# Patient Record
Sex: Male | Born: 1965
Health system: Southern US, Community
[De-identification: ages and names within clinical notes are randomized; demographics above are authoritative.]

## PROBLEM LIST (undated history)

## (undated) DIAGNOSIS — F329 Major depressive disorder, single episode, unspecified: Secondary | ICD-10-CM

## (undated) DIAGNOSIS — I10 Essential (primary) hypertension: Secondary | ICD-10-CM

## (undated) DIAGNOSIS — F909 Attention-deficit hyperactivity disorder, unspecified type: Secondary | ICD-10-CM

## (undated) DIAGNOSIS — N529 Male erectile dysfunction, unspecified: Secondary | ICD-10-CM

## (undated) DIAGNOSIS — F1020 Alcohol dependence, uncomplicated: Secondary | ICD-10-CM

## (undated) DIAGNOSIS — F32A Depression, unspecified: Secondary | ICD-10-CM

## (undated) HISTORY — DX: Depression, unspecified: F32.A

## (undated) HISTORY — DX: Male erectile dysfunction, unspecified: N52.9

## (undated) HISTORY — DX: Alcohol dependence, uncomplicated: F10.20

## (undated) HISTORY — PX: OTHER SURGICAL HISTORY: SHX169

## (undated) HISTORY — DX: Attention-deficit hyperactivity disorder, unspecified type: F90.9

## (undated) HISTORY — DX: Major depressive disorder, single episode, unspecified: F32.9

---

## 2006-06-27 ENCOUNTER — Ambulatory Visit: Payer: Self-pay | Admitting: Family Medicine

## 2006-08-07 ENCOUNTER — Ambulatory Visit: Payer: Self-pay | Admitting: Family Medicine

## 2006-12-05 ENCOUNTER — Ambulatory Visit: Payer: Self-pay | Admitting: Family Medicine

## 2006-12-11 ENCOUNTER — Encounter: Admission: RE | Admit: 2006-12-11 | Discharge: 2006-12-11 | Payer: Self-pay | Admitting: Family Medicine

## 2007-05-07 ENCOUNTER — Telehealth: Payer: Self-pay | Admitting: Family Medicine

## 2007-06-10 DIAGNOSIS — F418 Other specified anxiety disorders: Secondary | ICD-10-CM

## 2007-06-10 HISTORY — DX: Other specified anxiety disorders: F41.8

## 2007-07-15 ENCOUNTER — Ambulatory Visit: Payer: Self-pay | Admitting: Family Medicine

## 2007-07-15 DIAGNOSIS — F909 Attention-deficit hyperactivity disorder, unspecified type: Secondary | ICD-10-CM | POA: Insufficient documentation

## 2007-07-15 HISTORY — DX: Attention-deficit hyperactivity disorder, unspecified type: F90.9

## 2007-08-23 ENCOUNTER — Telehealth: Payer: Self-pay | Admitting: Family Medicine

## 2007-10-28 ENCOUNTER — Telehealth: Payer: Self-pay | Admitting: Family Medicine

## 2007-11-11 ENCOUNTER — Emergency Department (HOSPITAL_COMMUNITY): Admission: EM | Admit: 2007-11-11 | Discharge: 2007-11-11 | Payer: Self-pay | Admitting: Family Medicine

## 2007-12-16 ENCOUNTER — Ambulatory Visit: Payer: Self-pay | Admitting: Family Medicine

## 2008-01-15 ENCOUNTER — Telehealth: Payer: Self-pay | Admitting: Family Medicine

## 2008-02-12 ENCOUNTER — Telehealth: Payer: Self-pay | Admitting: Family Medicine

## 2008-03-16 ENCOUNTER — Telehealth: Payer: Self-pay | Admitting: Family Medicine

## 2008-04-07 ENCOUNTER — Telehealth: Payer: Self-pay | Admitting: Family Medicine

## 2008-06-18 ENCOUNTER — Telehealth: Payer: Self-pay | Admitting: Family Medicine

## 2008-09-18 ENCOUNTER — Telehealth: Payer: Self-pay | Admitting: Family Medicine

## 2008-11-02 ENCOUNTER — Telehealth: Payer: Self-pay | Admitting: Family Medicine

## 2008-11-27 ENCOUNTER — Telehealth: Payer: Self-pay | Admitting: Family Medicine

## 2009-02-26 ENCOUNTER — Telehealth: Payer: Self-pay | Admitting: Family Medicine

## 2009-03-31 ENCOUNTER — Telehealth: Payer: Self-pay | Admitting: Family Medicine

## 2009-04-06 ENCOUNTER — Telehealth: Payer: Self-pay | Admitting: Family Medicine

## 2009-05-07 ENCOUNTER — Telehealth: Payer: Self-pay | Admitting: Family Medicine

## 2009-05-12 ENCOUNTER — Ambulatory Visit: Payer: Self-pay | Admitting: Family Medicine

## 2009-06-11 ENCOUNTER — Telehealth: Payer: Self-pay | Admitting: Family Medicine

## 2009-06-25 ENCOUNTER — Ambulatory Visit: Payer: Self-pay | Admitting: Family Medicine

## 2009-06-25 DIAGNOSIS — N529 Male erectile dysfunction, unspecified: Secondary | ICD-10-CM

## 2009-06-25 HISTORY — DX: Male erectile dysfunction, unspecified: N52.9

## 2009-06-28 LAB — CONVERTED CEMR LAB
AST: 21 units/L (ref 0–37)
Albumin: 4.7 g/dL (ref 3.5–5.2)
Alkaline Phosphatase: 47 units/L (ref 39–117)
Basophils Absolute: 0 10*3/uL (ref 0.0–0.1)
Basophils Relative: 0 % (ref 0–1)
Bilirubin, Direct: 0.3 mg/dL (ref 0.0–0.3)
Calcium: 9.5 mg/dL (ref 8.4–10.5)
Glucose, Bld: 72 mg/dL (ref 70–99)
Hemoglobin: 16.7 g/dL (ref 13.0–17.0)
Lymphocytes Relative: 33 % (ref 12–46)
MCHC: 34.4 g/dL (ref 30.0–36.0)
Monocytes Absolute: 0.6 10*3/uL (ref 0.1–1.0)
Neutro Abs: 4.1 10*3/uL (ref 1.7–7.7)
Neutrophils Relative %: 58 % (ref 43–77)
Platelets: 232 10*3/uL (ref 150–400)
Potassium: 5 meq/L (ref 3.5–5.3)
RDW: 12.9 % (ref 11.5–15.5)
Sodium: 143 meq/L (ref 135–145)
Total Bilirubin: 1.4 mg/dL — ABNORMAL HIGH (ref 0.3–1.2)

## 2009-06-29 ENCOUNTER — Telehealth: Payer: Self-pay | Admitting: Family Medicine

## 2009-07-14 ENCOUNTER — Telehealth: Payer: Self-pay | Admitting: Family Medicine

## 2009-08-12 ENCOUNTER — Telehealth: Payer: Self-pay | Admitting: Family Medicine

## 2009-08-24 ENCOUNTER — Telehealth: Payer: Self-pay | Admitting: Family Medicine

## 2009-10-01 ENCOUNTER — Telehealth: Payer: Self-pay | Admitting: Family Medicine

## 2009-10-20 ENCOUNTER — Telehealth: Payer: Self-pay | Admitting: Family Medicine

## 2009-11-18 ENCOUNTER — Telehealth: Payer: Self-pay | Admitting: Family Medicine

## 2009-12-15 ENCOUNTER — Ambulatory Visit: Payer: Self-pay | Admitting: Family Medicine

## 2009-12-15 DIAGNOSIS — J209 Acute bronchitis, unspecified: Secondary | ICD-10-CM | POA: Insufficient documentation

## 2009-12-15 DIAGNOSIS — K089 Disorder of teeth and supporting structures, unspecified: Secondary | ICD-10-CM | POA: Insufficient documentation

## 2009-12-27 ENCOUNTER — Telehealth: Payer: Self-pay | Admitting: Family Medicine

## 2009-12-27 ENCOUNTER — Telehealth (INDEPENDENT_AMBULATORY_CARE_PROVIDER_SITE_OTHER): Payer: Self-pay | Admitting: *Deleted

## 2010-01-13 ENCOUNTER — Telehealth: Payer: Self-pay | Admitting: Family Medicine

## 2010-02-11 ENCOUNTER — Telehealth: Payer: Self-pay | Admitting: Family Medicine

## 2010-03-11 ENCOUNTER — Telehealth: Payer: Self-pay | Admitting: Family Medicine

## 2010-04-13 ENCOUNTER — Telehealth: Payer: Self-pay | Admitting: Family Medicine

## 2010-05-13 ENCOUNTER — Telehealth: Payer: Self-pay | Admitting: Family Medicine

## 2010-06-14 ENCOUNTER — Telehealth: Payer: Self-pay | Admitting: Family Medicine

## 2010-07-20 ENCOUNTER — Telehealth: Payer: Self-pay | Admitting: Family Medicine

## 2010-08-18 ENCOUNTER — Telehealth: Payer: Self-pay | Admitting: Family Medicine

## 2010-08-23 ENCOUNTER — Telehealth: Payer: Self-pay | Admitting: Family Medicine

## 2010-08-31 ENCOUNTER — Telehealth: Payer: Self-pay | Admitting: Family Medicine

## 2010-09-29 ENCOUNTER — Telehealth: Payer: Self-pay | Admitting: Family Medicine

## 2010-10-10 ENCOUNTER — Telehealth: Payer: Self-pay | Admitting: Family Medicine

## 2010-10-13 NOTE — Progress Notes (Signed)
Summary: Call-A-Nurse Report    Call-A-Nurse Triage Call Report Triage Record Num: [id number] Operator: Caswell Corwin Patient Name: Shane Reese Call Date & Time: 12/25/2009 11:07:09AM Patient Phone: [phone] PCP: Tera Mater. Clent Ridges Patient Gender: Male PCP Fax : [phone] Patient DOB: 05/19/1957 [facility] Name: [facility] Reason for Call: Wife/Anita calling that pt has a cold with cough and a toothache with pain. Is on Augmentin. Scheduled for a root canal 12/28/09. Triaged tooth/gum and offered appt at office, but states he cannot make that. Ins to go to [facility] pr[facility]. Protocol(s) Used: Tooth, Gum and Jaw Symptoms Recommended Outcome per Protocol: See Provider within 72 Hours Reason for Outcome: Persistent pain more than one week that interferes with sleep AND not previously evaluated Care Advice:  ~ 12/25/2009 11:17:40AM Page 1 of 1 [id number]

## 2010-10-13 NOTE — Progress Notes (Signed)
Summary: new rx  Phone Note Call from Patient Call back at Work Phone [phone]   Caller: Patient Call For: Nelwyn Salisbury MD Summary of Call: pt needs new rx for percocet 10-650 mg Initial call taken by: Heron Sabins,  December 27, 2009 4:03 PM  Follow-up for Phone Call        done for now, but no more. He needs to see his dentist  Follow-up by: Nelwyn Salisbury MD,  December 27, 2009 5:37 PM  Additional Follow-up for Phone Call Additional follow up Details #1::        called for pick-up. Additional Follow-up by: Raechel Ache, RN,  December 28, 2009 8:00 AM    New/Updated Medications: AUGMENTIN 875-125 MG TABS (AMOXICILLIN-POT CLAVULANATE) two times a day Prescriptions: AUGMENTIN 875-125 MG TABS (AMOXICILLIN-POT CLAVULANATE) two times a day  #20 x 0   Entered and Authorized by:   Nelwyn Salisbury MD   Signed by:   Nelwyn Salisbury MD on 12/27/2009   Method used:   Print then Give to Patient   RxID:   [id number] PERCOCET 10-650 MG TABS (OXYCODONE-ACETAMINOPHEN) 1 q 6 hours as needed pain  #30 x 0   Entered and Authorized by:   Nelwyn Salisbury MD   Signed by:   Nelwyn Salisbury MD on 12/27/2009   Method used:   Print then Give to Patient   RxID:   [id number]

## 2010-10-13 NOTE — Progress Notes (Signed)
Summary: Pt req new script for Adderall 20mg . Pt lost his meds  Phone Note Call from Patient Call back at Work Phone [phone]   Caller: Patient Summary of Call: Pt called and is req new scripts for Adderall 20mg . 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 Pt said that his gym bag has not been turned in.  Initial call taken by: Lucy Antigua,  October 01, 2009 2:07 PM  Follow-up for Phone Call        done  Follow-up by: Nelwyn Salisbury MD,  October 01, 2009 3:29 PM  Additional Follow-up for Phone Call Additional follow up Details #1::        rx up front ready for p/u, l/m at home to p/u Additional Follow-up by: Alfred Levins, CMA,  October 01, 2009 3:38 PM    New/Updated Medications: ADDERALL 20 MG  TABS (AMPHETAMINE-DEXTROAMPHETAMINE) three times a day Prescriptions: ADDERALL 20 MG  TABS (AMPHETAMINE-DEXTROAMPHETAMINE) three times a day  #90 x 0   Entered and Authorized by:   Nelwyn Salisbury MD   Signed by:   Nelwyn Salisbury MD on 10/01/2009   Method used:   Print then Give to Patient   RxID:   [id number]

## 2010-10-13 NOTE — Progress Notes (Signed)
Summary: refill  Phone Note Call from Patient Call back at Work Phone [phone]   Caller: Patient-----live call Reason for Call: Refill Medication Summary of Call: Rf Adderall 10mg  ( 6 per day) or Adderall 20mg  (3 per day). Please call when ready. Initial call taken by: Warnell Forester,  November 18, 2009 4:03 PM  Follow-up for Phone Call        done Follow-up by: Nelwyn Salisbury MD,  November 19, 2009 1:30 PM  Additional Follow-up for Phone Call Additional follow up Details #1::        rx up front ready for p/u, pt aware Additional Follow-up by: Alfred Levins, CMA,  November 19, 2009 1:41 PM    Prescriptions: ADDERALL 20 MG  TABS (AMPHETAMINE-DEXTROAMPHETAMINE) three times a day  #90 x 0   Entered and Authorized by:   Nelwyn Salisbury MD   Signed by:   Nelwyn Salisbury MD on 11/19/2009   Method used:   Print then Give to Patient   RxID:   [id number]

## 2010-10-13 NOTE — Progress Notes (Signed)
Summary: Pt req script for Adderall 20mg    Phone Note Refill Request Call back at Work Phone [phone]   Refills Requested: Medication #1:  ADDERALL 20 MG  TABS three times a day   Dosage confirmed as above?Dosage Confirmed   Supply Requested: 1 month Pt req 3 month script if possible, if not 1 month is ok. Pls call when ready for pick up.    Method Requested: Pick up at Office Initial call taken by: Lucy Antigua,  September 29, 2010 4:38 PM  Follow-up for Phone Call        done Follow-up by: Nelwyn Salisbury MD,  September 30, 2010 1:31 PM  Additional Follow-up for Phone Call Additional follow up Details #1::        pt notiifed.  Additional Follow-up by: Pura Spice, RN,  September 30, 2010 1:56 PM    New/Updated Medications: ADDERALL 20 MG  TABS (AMPHETAMINE-DEXTROAMPHETAMINE) three times a day, may fill on 10-31-10 ADDERALL 20 MG  TABS (AMPHETAMINE-DEXTROAMPHETAMINE) three times a day, may fill on 11-29-10 Prescriptions: ADDERALL 20 MG  TABS (AMPHETAMINE-DEXTROAMPHETAMINE) three times a day, may fill on 11-29-10  #90 x 0   Entered and Authorized by:   Nelwyn Salisbury MD   Signed by:   Nelwyn Salisbury MD on 09/30/2010   Method used:   Print then Give to Patient   RxID:   [id number] ADDERALL 20 MG  TABS (AMPHETAMINE-DEXTROAMPHETAMINE) three times a day, may fill on 10-31-10  #90 x 0   Entered and Authorized by:   Nelwyn Salisbury MD   Signed by:   Nelwyn Salisbury MD on 09/30/2010   Method used:   Print then Give to Patient   RxID:   [id number] ADDERALL 20 MG  TABS (AMPHETAMINE-DEXTROAMPHETAMINE) three times a day  #90 x 0   Entered and Authorized by:   Nelwyn Salisbury MD   Signed by:   Nelwyn Salisbury MD on 09/30/2010   Method used:   Print then Give to Patient   RxID:   [id number]

## 2010-10-13 NOTE — Progress Notes (Signed)
Summary: pt is out of med  Phone Note Refill Request Call back at Work Phone [phone] Message from:  Patient  Refills Requested: Medication #1:  ADDERALL 20 MG  TABS three times a day Pt needs rx today  Initial call taken by: Heron Sabins,  August 18, 2010 3:18 PM  Follow-up for Phone Call        left mess to return  call  Follow-up by: Pura Spice, RN,  August 18, 2010 4:19 PM  Additional Follow-up for Phone Call Additional follow up Details #1::        ok 1 month Additional Follow-up by: Edwyna Perfect MD,  August 18, 2010 4:30 PM    Additional Follow-up for Phone Call Additional follow up Details #2::    left mess for pt that rx ready  Follow-up by: Pura Spice, RN,  August 19, 2010 8:32 AM  Prescriptions: ADDERALL 20 MG  TABS (AMPHETAMINE-DEXTROAMPHETAMINE) three times a day  #90 x 0   Entered and Authorized by:   Edwyna Perfect MD   Signed by:   Edwyna Perfect MD on 08/18/2010   Method used:   Print then Give to Patient   RxID:   [id number]

## 2010-10-13 NOTE — Progress Notes (Signed)
Summary: new rx  Phone Note Call from Patient Call back at Work Phone [phone]   Caller: Patient Call For: Nelwyn Salisbury MD Summary of Call: pt needs new rx adderall 20 mg. pt call when ready for pick up. Initial call taken by: Heron Sabins,  Jan 13, 2010 11:01 AM  Follow-up for Phone Call        done Follow-up by: Nelwyn Salisbury MD,  Jan 13, 2010 11:38 AM  Additional Follow-up for Phone Call Additional follow up Details #1::        Phone Call Completed Additional Follow-up by: Raechel Ache, RN,  Jan 13, 2010 11:38 AM    Prescriptions: ADDERALL 20 MG  TABS (AMPHETAMINE-DEXTROAMPHETAMINE) three times a day  #90 x 0   Entered and Authorized by:   Nelwyn Salisbury MD   Signed by:   Nelwyn Salisbury MD on 01/13/2010   Method used:   Print then Give to Patient   RxID:   [id number]

## 2010-10-13 NOTE — Progress Notes (Signed)
Summary: Pt req script for Adderall 20mg   Phone Note Refill Request Call back at Work Phone [phone] Message from:  Patient on July 20, 2010 3:34 PM  Refills Requested: Medication #1:  ADDERALL 20 MG  TABS three times a day   Dosage confirmed as above?Dosage Confirmed  Method Requested: Pick up at Office Initial call taken by: Lucy Antigua,  July 20, 2010 3:34 PM  Follow-up for Phone Call        Adderall is currently not available. try generic Ritalin for one month Follow-up by: Nelwyn Salisbury MD,  July 21, 2010 8:05 AM  Additional Follow-up for Phone Call Additional follow up Details #1::        left mess to return call  Additional Follow-up by: Pura Spice, RN,  July 21, 2010 8:55 AM    Additional Follow-up for Phone Call Additional follow up Details #2::    Pt is aware and he picked up his signed rx. Follow-up by: Warnell Forester,  July 21, 2010 4:26 PM  New/Updated Medications: RITALIN 20 MG TABS (METHYLPHENIDATE HCL) three times a day Prescriptions: RITALIN 20 MG TABS (METHYLPHENIDATE HCL) three times a day  #90 x 0   Entered and Authorized by:   Nelwyn Salisbury MD   Signed by:   Nelwyn Salisbury MD on 07/21/2010   Method used:   Print then Give to Patient   RxID:   [id number]

## 2010-10-13 NOTE — Progress Notes (Signed)
Summary: REFILL REQUEST  Phone Note Refill Request Message from:  Patient on May 13, 2010 2:14 PM  Refills Requested: Medication #1:  ADDERALL 20 MG  TABS three times a day   Notes: Pt can be reached at [phone] when Rx is ready.    Initial call taken by: Debbra Riding,  May 13, 2010 2:14 PM    Prescriptions: ADDERALL 20 MG  TABS (AMPHETAMINE-DEXTROAMPHETAMINE) three times a day  #90 x 0   Entered and Authorized by:   Nelwyn Salisbury MD   Signed by:   Nelwyn Salisbury MD on 05/13/2010   Method used:   Print then Give to Patient   RxID:   [id number]

## 2010-10-13 NOTE — Progress Notes (Signed)
Summary: REQUEST FOR ALTERNATIVE MED / RX (Adderall 20mg  n/a)  Phone Note Call from Patient   Caller: Patient   [phone] Summary of Call: Pt called to state that he needs to have a Rx prepared for something to substitute his Rx for Adderall 20mg .... Pt adv that he has gone to 4 different pharmacies and they are all out of medication.... Pt can be reached at [phone].  Initial call taken by: Debbra Riding,  August 23, 2010 3:45 PM  Follow-up for Phone Call        okay, we will go with Ritalin again for one month Follow-up by: Nelwyn Salisbury MD,  August 24, 2010 1:45 PM  Additional Follow-up for Phone Call Additional follow up Details #1::        Notified pt. Additional Follow-up by: Lynann Beaver CMA AAMA,  August 24, 2010 2:04 PM    Prescriptions: RITALIN 20 MG TABS (METHYLPHENIDATE HCL) three times a day  #90 x 0   Entered and Authorized by:   Nelwyn Salisbury MD   Signed by:   Nelwyn Salisbury MD on 08/24/2010   Method used:   Print then Give to Patient   RxID:   [id number]

## 2010-10-13 NOTE — Progress Notes (Signed)
Summary: refill  Phone Note Refill Request Call back at Work Phone [phone] Message from:  Patient---live call  Refills Requested: Medication #1:  ADDERALL 20 MG  TABS three times a day call pt when ready.  Initial call taken by: Warnell Forester,  February 11, 2010 2:40 PM  Follow-up for Phone Call        done  Follow-up by: Nelwyn Salisbury MD,  February 11, 2010 4:11 PM    Prescriptions: ADDERALL 20 MG  TABS (AMPHETAMINE-DEXTROAMPHETAMINE) three times a day  #90 x 0   Entered and Authorized by:   Nelwyn Salisbury MD   Signed by:   Nelwyn Salisbury MD on 02/11/2010   Method used:   Print then Give to Patient   RxID:   [id number]

## 2010-10-13 NOTE — Assessment & Plan Note (Signed)
Summary: cough/chest congestion/clamy/cjr   Vital Signs:  Patient profile:   45 year old male Temp:     97.5 degrees F oral Pulse rate:   76 / minute Pulse rhythm:   regular Resp:     12 per minute BP sitting:   120 / 80  (left arm) Cuff size:   regular  Vitals Entered By: Gladis Riffle, RN (December 15, 2009 2:36 PM) CC: c/o cough and congestion x 1 week, also has toothache that was told to see pcp about Is Patient Diabetic? No   History of Present Illness: Here for 2 reasons. First he has had one week of chest congestion and coughing up green sputum. No fever. Second he has had 2 days of severe tooth pain. he has a bad tooth on the left lower jaw, and he needs some work done on it. he could not see his dentist until tomorrow, and they told him to see [place] about this.   Preventive Screening-Counseling & Management  Alcohol-Tobacco     Smoking Status: never  Current Medications (verified): 1)  Ativan 1 Mg  Tabs (Lorazepam) .... As Needed 2)  Suboxone 8-2 Mg  Subl (Buprenorphine Hcl-Naloxone Hcl) .... Once Daily 3)  Adderall 20 Mg  Tabs (Amphetamine-Dextroamphetamine) .... Three Times A Day 4)  Cialis 20 Mg Tabs (Tadalafil) .... As Needed  Allergies (verified): No Known Drug Allergies  Past History:  Past Medical History: Reviewed history from 06/25/2009 and no changes required. Alcohol/Drug Problem, sees [facility]. Abused cocaine and narcotics in the past.  Depression ADHD ED  Review of Systems  The patient denies anorexia, fever, weight loss, weight gain, vision loss, decreased hearing, hoarseness, chest pain, syncope, dyspnea on exertion, peripheral edema, headaches, hemoptysis, abdominal pain, melena, hematochezia, severe indigestion/heartburn, hematuria, incontinence, genital sores, muscle weakness, suspicious skin lesions, transient blindness, difficulty walking, depression, unusual weight change, abnormal bleeding, enlarged lymph nodes, angioedema, breast  masses, and testicular masses.    Physical Exam  General:  Well-developed,well-nourished,in no acute distress; alert,appropriate and cooperative throughout examination Head:  Normocephalic and atraumatic without obvious abnormalities. No apparent alopecia or balding. Eyes:  No corneal or conjunctival inflammation noted. EOMI. Perrla. Funduscopic exam benign, without hemorrhages, exudates or papilledema. Vision grossly normal. Ears:  External ear exam shows no significant lesions or deformities.  Otoscopic examination reveals clear canals, tympanic membranes are intact bilaterally without bulging, retraction, inflammation or discharge. Hearing is grossly normal bilaterally. Nose:  External nasal examination shows no deformity or inflammation. Nasal mucosa are pink and moist without lesions or exudates. Mouth:  Oral mucosa and oropharynx without lesions or exudates.  he has a broken off molar on the left lower side Neck:  No deformities, masses, or tenderness noted. Lungs:  scattered rhonchi   Impression & Recommendations:  Problem # 1:  ACUTE BRONCHITIS (ICD-466.0)  His updated medication list for this problem includes:    Augmentin 875-125 Mg Tabs (Amoxicillin-pot clavulanate) .Marland Kitchen..Marland Kitchen Two times a day  Problem # 2:  DENTAL PAIN (ICD-525.9)  Complete Medication List: 1)  Ativan 1 Mg Tabs (Lorazepam) .... As needed 2)  Suboxone 8-2 Mg Subl (Buprenorphine hcl-naloxone hcl) .... Once daily 3)  Adderall 20 Mg Tabs (Amphetamine-dextroamphetamine) .... Three times a day 4)  Cialis 20 Mg Tabs (Tadalafil) .... As needed 5)  Augmentin 875-125 Mg Tabs (Amoxicillin-pot clavulanate) .... Two times a day 6)  Percocet 10-650 Mg Tabs (Oxycodone-acetaminophen) .Marland Kitchen.. 1 q 6 hours as needed pain  Patient Instructions: 1)  See your  dentist as scheduled. Prescriptions: ADDERALL 20 MG  TABS (AMPHETAMINE-DEXTROAMPHETAMINE) three times a day  #90 x 0   Entered and Authorized by:   Nelwyn Salisbury MD   Signed  by:   Nelwyn Salisbury MD on 12/15/2009   Method used:   Print then Give to Patient   RxID:   [id number] PERCOCET 10-650 MG TABS (OXYCODONE-ACETAMINOPHEN) 1 q 6 hours as needed pain  #30 x 0   Entered and Authorized by:   Nelwyn Salisbury MD   Signed by:   Nelwyn Salisbury MD on 12/15/2009   Method used:   Print then Give to Patient   RxID:   [id number] AUGMENTIN 875-125 MG TABS (AMOXICILLIN-POT CLAVULANATE) two times a day  #20 x 0   Entered and Authorized by:   Nelwyn Salisbury MD   Signed by:   Nelwyn Salisbury MD on 12/15/2009   Method used:   Print then Give to Patient   RxID:   [id number]

## 2010-10-13 NOTE — Progress Notes (Signed)
Summary: Pt req scripts for Adderall 20mg    Phone Note Refill Request Call back at Work Phone [phone] Message from:  Patient on June 14, 2010 11:50 AM  Refills Requested: Medication #1:  ADDERALL 20 MG  TABS three times a day   Dosage confirmed as above?Dosage Confirmed   Supply Requested: 3 months  Method Requested: Pick up at Office Initial call taken by: Lucy Antigua,  June 14, 2010 11:50 AM  Follow-up for Phone Call        done Follow-up by: Nelwyn Salisbury MD,  June 14, 2010 4:58 PM  Additional Follow-up for Phone Call Additional follow up Details #1::        pt aware. Additional Follow-up by: Pura Spice, RN,  June 14, 2010 5:07 PM    New/Updated Medications: ADDERALL 20 MG  TABS (AMPHETAMINE-DEXTROAMPHETAMINE) three times a day Prescriptions: ADDERALL 20 MG  TABS (AMPHETAMINE-DEXTROAMPHETAMINE) three times a day  #90 x 0   Entered and Authorized by:   Nelwyn Salisbury MD   Signed by:   Nelwyn Salisbury MD on 06/14/2010   Method used:   Print then Give to Patient   RxID:   [id number]

## 2010-10-13 NOTE — Progress Notes (Signed)
Summary: pt req scripts for Adderall 20mg  - req 3 month   Phone Note Refill Request Call back at Work Phone [phone] Message from:  Patient on April 13, 2010 10:48 AM  Refills Requested: Medication #1:  ADDERALL 20 MG  TABS three times a day   Dosage confirmed as above?Dosage Confirmed   Supply Requested: 3 months  Method Requested: Pick up at Office Initial call taken by: Lucy Antigua,  April 13, 2010 10:48 AM  Follow-up for Phone Call        done Follow-up by: Nelwyn Salisbury MD,  April 13, 2010 1:28 PM  Additional Follow-up for Phone Call Additional follow up Details #1::        called. Additional Follow-up by: Raechel Ache, RN,  April 13, 2010 1:47 PM    Prescriptions: ADDERALL 20 MG  TABS (AMPHETAMINE-DEXTROAMPHETAMINE) three times a day  #90 x 0   Entered and Authorized by:   Nelwyn Salisbury MD   Signed by:   Nelwyn Salisbury MD on 04/13/2010   Method used:   Print then Give to Patient   RxID:   [id number]

## 2010-10-13 NOTE — Progress Notes (Signed)
Summary: REFILL REQUEST  Phone Note Refill Request Message from:  Patient on March 11, 2010 11:50 AM  Refills Requested: Medication #1:  ADDERALL 20 MG  TABS three times a day   Notes: Pt can be reached at [phone] when Rx is ready for p/u.    Initial call taken by: Debbra Riding,  March 11, 2010 11:51 AM  Follow-up for Phone Call        done Follow-up by: Nelwyn Salisbury MD,  March 11, 2010 4:12 PM    Prescriptions: ADDERALL 20 MG  TABS (AMPHETAMINE-DEXTROAMPHETAMINE) three times a day  #90 x 0   Entered and Authorized by:   Nelwyn Salisbury MD   Signed by:   Nelwyn Salisbury MD on 03/11/2010   Method used:   Print then Give to Patient   RxID:   [id number]

## 2010-10-13 NOTE — Progress Notes (Signed)
Summary: new rx  Phone Note Call from Patient Call back at Work Phone [phone]   Caller: Patient Call For: Nelwyn Salisbury MD Summary of Call: pt needs adderall time release no other adderall is avail now Initial call taken by: Heron Sabins,  August 31, 2010 2:44 PM  Follow-up for Phone Call        done  Follow-up by: Nelwyn Salisbury MD,  August 31, 2010 5:22 PM  Additional Follow-up for Phone Call Additional follow up Details #1::        pt called  Additional Follow-up by: Pura Spice, RN,  September 01, 2010 10:32 AM    New/Updated Medications: ADDERALL XR 20 MG XR24H-CAP (AMPHETAMINE-DEXTROAMPHETAMINE) two times a day Prescriptions: ADDERALL XR 20 MG XR24H-CAP (AMPHETAMINE-DEXTROAMPHETAMINE) two times a day  #60 x 0   Entered and Authorized by:   Nelwyn Salisbury MD   Signed by:   Nelwyn Salisbury MD on 08/31/2010   Method used:   Print then Give to Patient   RxID:   [id number]

## 2010-10-13 NOTE — Progress Notes (Signed)
Summary: change med  Phone Note Call from Patient Call back at [phone]   Caller: Patient Call For: Shane Reese Summary of Call: pts wife called and said that all pharmacies have adderall 20mg  tablets on back order and they would need a rx for 2 10mg  tablets.  Pt will bring the rx's back for [place] to shred.  Call when ready for p/u Initial call taken by: Alfred Levins, CMA,  October 20, 2009 4:36 PM  Follow-up for Phone Call        done Follow-up by: Nelwyn Salisbury MD,  October 21, 2009 8:31 AM  Additional Follow-up for Phone Call Additional follow up Details #1::        Pt. notified. Additional Follow-up by: Lynann Beaver CMA,  October 21, 2009 8:45 AM    New/Updated Medications: ADDERALL 10 MG TABS (AMPHETAMINE-DEXTROAMPHETAMINE) 2 tabs three times a day Prescriptions: ADDERALL 10 MG TABS (AMPHETAMINE-DEXTROAMPHETAMINE) 2 tabs three times a day  #180 x 0   Entered and Authorized by:   Nelwyn Salisbury MD   Signed by:   Nelwyn Salisbury MD on 10/21/2009   Method used:   Print then Give to Patient   RxID:   [id number]

## 2010-10-19 NOTE — Progress Notes (Signed)
Summary: rx lorazepam   Phone Note From Pharmacy   Caller: [facility]* Summary of Call: refill loarazpam 1 mg  Initial call taken by: Pura Spice, RN,  October 10, 2010 5:03 PM  Follow-up for Phone Call        call in #60 with 5 rf Follow-up by: Nelwyn Salisbury MD,  October 10, 2010 5:24 PM  Additional Follow-up for Phone Call Additional follow up Details #1::        completed, Additional Follow-up by: Pura Spice, RN,  October 11, 2010 8:10 AM    New/Updated Medications: ATIVAN 1 MG  TABS (LORAZEPAM) 1 by mouth every six hrs as needed anxiety Prescriptions: ATIVAN 1 MG  TABS (LORAZEPAM) 1 by mouth every six hrs as needed anxiety  #60 x 5   Entered by:   Pura Spice, RN   Authorized by:   Nelwyn Salisbury MD   Signed by:   Pura Spice, RN on 10/11/2010   Method used:   Telephoned to ...       [facility]* (retail)       [address]       [place], [place]  [id number]       Ph: [phone]       Fax: [phone]   RxID:   [id number]

## 2010-12-28 ENCOUNTER — Other Ambulatory Visit: Payer: Self-pay | Admitting: Family Medicine

## 2010-12-28 MED ORDER — AMPHETAMINE-DEXTROAMPHETAMINE 20 MG PO TABS: 20.0000 mg | ORAL_TABLET | Freq: Three times a day (TID) | ORAL | Status: DC

## 2010-12-28 NOTE — Telephone Encounter (Signed)
The most I can write for at one time is 90 days, so I wrote for #270

## 2010-12-28 NOTE — Telephone Encounter (Signed)
Pt aware rx ready for pick up. 

## 2010-12-28 NOTE — Telephone Encounter (Signed)
Pt called req refill of Adderrall 20 mg.  Pt req 6 month script if possible. This was suggested to pt by pharmacist at [facility].

## 2011-01-26 ENCOUNTER — Telehealth: Payer: Self-pay | Admitting: Family Medicine

## 2011-01-26 NOTE — Telephone Encounter (Signed)
Pt needs new rx adderall 25 mg. Pt will make ov when he pick up rx. Pt dad is in hosp critical.

## 2011-01-27 ENCOUNTER — Telehealth: Payer: Self-pay | Admitting: Family Medicine

## 2011-01-27 MED ORDER — AMPHETAMINE-DEXTROAMPHETAMINE 20 MG PO TABS: 20.0000 mg | ORAL_TABLET | Freq: Three times a day (TID) | ORAL | Status: DC

## 2011-01-27 NOTE — Telephone Encounter (Signed)
NO, we gave him #270 of Adderall 20 mg on 12-28-10. This is way too soon

## 2011-01-27 NOTE — Assessment & Plan Note (Signed)
[facility] OFFICE NOTE   Shane Reese, Shane Reese                        MRN:          [id number]  DATE:06/27/2006                            DOB:          [birth year]    This is a 45 year old gentleman here to establish with our practice who has  several problems to discuss.  He has not had a primary care physician in  many years.  First off, for the past week he has had intermittent periods of  nausea without vomiting, mild diffuse abdominal cramps, and watery diarrhea.  There has been no frank pain and no fever.  He is able to keep fluids down  quite well, as well as some food.  He does complain of feeling somewhat  fatigued.  The second issue is for the last 2 weeks he has had increased  pain and stiffness in the lower back.  He has a long history of intermittent  low back pain.  There has been no particular history of trauma but it has  been bothering him a little bit more than usual over the last 2 weeks.  He  also describes numbness along the top of the left foot.  This seems to  correlate with when his back pain flares up.  He is quite active and works  out in a gym on a regular basis.  Lastly, he describes a great source of  stress in his life.  He does not feel it needs to treated particularly at  this time but wants me to be aware of it.  He has been married for 10 years  and feels that his marriage is fairly stable right now.  However, 5 years  ago he learned that his 43-year-old son is actually the offspring of an  affair that his wife had had with another man.  He said it was extremely  difficult to deal with for several years after that.  They actually were  separated for a while but then got back together.  He now has a good  relationship with his wife.  It still bothers him from time to time when he  thinks about it, but he denies any particular signs of depression about it.  He and his wife saw a  marriage counselor for some time but he no longer  feels it is necessary.  He says he sleeps well, his appetite is good, and he  does not think he needs treatment for this issue currently.   OTHER PAST MEDICAL HISTORY:  Some years ago he fractured his left lower leg.  He did have surgery to repair it and has some hardware still in place.   ALLERGIES:  None.   CURRENT MEDICATIONS:  None.   HABITS:  He does not use tobacco.  He does drink some alcohol.  He admits to  drinking quite heavily 5 years ago when he found out the situation described  above.  He also became heavily involved with illicit drugs during that year  or two after he found out about this  situation.  He says he has cleaned up  his act and admits to still occasionally using illicit drugs, but feels that  he has the situation under control.   SOCIAL HISTORY:  He is married.  He has two children.  He owns a [employer].   FAMILY HISTORY:  Unremarkable.   OBJECTIVE:  VITAL SIGNS:  Height 6 feet 1 inch, weight 183, BP 118/66, pulse  76 and regular, temperature 97.5 degrees.  GENERAL:  He is alert.  He appears to be in no distress.  No signs of  dehydration.  NECK:  Supple without lymphadenopathy or masses.  LUNGS:  Clear.  CARDIAC:  Rate and rhythm regular without gallops, murmurs, rubs.  Distal  pulses are full.  ABDOMEN:  Soft, normal bowel sounds, nontender, no masses.  NEUROLOGIC:  Examination of his lower back is completely within normal  limits.  There is no tenderness, no spasms.  He has full range of motion.  His affect is bright.   ASSESSMENT AND PLAN:  1. History of depression and admitted drug abuse.  Apparently this is      under control at the current time.  I urged the patient to come back      and discuss it with me if he felt he needed assistance.  2. Chronic lumbar pain.  Voltaren 50 mg t.i.d. as needed and Flexeril 10      mg t.i.d. as needed.  Also, moist heat.  3. Acute gastroenteritis.   He will continue to push fluids.  He can use      Imodium AD as needed.  Will add 10 days of Cipro 500 mg b.i.d.            ______________________________  Tera Mater. Clent Ridges, MD     SAF/MedQ  DD:  06/29/2006  DT:  07/01/2006  Job #:  [id number]

## 2011-01-27 NOTE — Telephone Encounter (Signed)
Done

## 2011-01-27 NOTE — Telephone Encounter (Signed)
Pt called back and said that his ins denied to fill #270 of his generic Adderall. Pharmacist @ [facility] confirmed that he only filled #90 (30 day) supply. Pt will pick rx today per Dr Clent Ridges.

## 2011-01-27 NOTE — Telephone Encounter (Signed)
LMOV for pt to return call.

## 2011-03-24 ENCOUNTER — Other Ambulatory Visit: Payer: Self-pay

## 2011-03-24 MED ORDER — AMPHETAMINE-DEXTROAMPHETAMINE 20 MG PO TABS: 20.0000 mg | ORAL_TABLET | Freq: Three times a day (TID) | ORAL | Status: DC

## 2011-04-21 ENCOUNTER — Encounter: Payer: Self-pay | Admitting: Family Medicine

## 2011-04-24 ENCOUNTER — Encounter: Payer: Self-pay | Admitting: Family Medicine

## 2011-04-24 ENCOUNTER — Ambulatory Visit (INDEPENDENT_AMBULATORY_CARE_PROVIDER_SITE_OTHER): Payer: Self-pay | Admitting: Family Medicine

## 2011-04-24 VITALS — BP 136/88 | HR 80 | Temp 98.1°F | Wt 200.0 lb

## 2011-04-24 DIAGNOSIS — M722 Plantar fascial fibromatosis: Secondary | ICD-10-CM

## 2011-04-24 DIAGNOSIS — F909 Attention-deficit hyperactivity disorder, unspecified type: Secondary | ICD-10-CM

## 2011-04-24 MED ORDER — LORAZEPAM 1 MG PO TABS: 1.0000 mg | ORAL_TABLET | Freq: Four times a day (QID) | ORAL | Status: DC | PRN

## 2011-04-24 MED ORDER — AMPHETAMINE-DEXTROAMPHETAMINE 20 MG PO TABS: 20.0000 mg | ORAL_TABLET | Freq: Three times a day (TID) | ORAL | Status: DC

## 2011-04-24 NOTE — Progress Notes (Signed)
  Subjective:    Patient ID: Shane Reese, male    DOB: November 23, 1965, 45 y.o.   MRN: [id number]  HPI Here for med refills and also to ask about sharp pains in the bottom of the left foot that started about one month ago after a beach trip. The arch of the foot hurts, especially at the bottom of the heel. No trauma hx. He has used Motrin and ice packs, and this helps. No swelling. His ADHD has been stable and he does well on the Adderall.    Review of Systems  Constitutional: Negative.   Musculoskeletal: Positive for arthralgias.  Neurological: Negative.   Psychiatric/Behavioral: Negative.        Objective:   Physical Exam  Constitutional: He is oriented to person, place, and time. He appears well-developed and well-nourished.  Cardiovascular: Normal rate, regular rhythm, normal heart sounds and intact distal pulses.   Pulmonary/Chest: Effort normal.  Musculoskeletal:       The arch of the left foot is tender, no masses   Neurological: He is alert and oriented to person, place, and time. No cranial nerve deficit. He exhibits normal muscle tone. Coordination normal.  Psychiatric: He has a normal mood and affect. His behavior is normal. Thought content normal.          Assessment & Plan:  Use good supportive shoes and wear gel arch inserts inside the shoes. Do not go barefoot. Continue ice and Motrin. Instructed him on stretching exercises to do tid. Refilled meds

## 2011-06-05 LAB — INFLUENZA A AND B ANTIGEN (CONVERTED LAB)
Inflenza A Ag: POSITIVE — AB
Influenza B Ag: NEGATIVE

## 2011-07-21 ENCOUNTER — Other Ambulatory Visit: Payer: Self-pay | Admitting: Family Medicine

## 2011-07-21 NOTE — Telephone Encounter (Signed)
Pt called req 3 month script for amphetamine-dextroamphetamine (ADDERALL, 20MG ,) 20 MG tablet

## 2011-07-21 NOTE — Telephone Encounter (Signed)
Pt will be out of med this weekend

## 2011-07-24 MED ORDER — AMPHETAMINE-DEXTROAMPHETAMINE 20 MG PO TABS: 20.0000 mg | ORAL_TABLET | Freq: Three times a day (TID) | ORAL | Status: DC

## 2011-07-24 NOTE — Telephone Encounter (Signed)
Script is ready for pick up, tried to reach pt, no answer. 

## 2011-07-24 NOTE — Telephone Encounter (Signed)
done

## 2011-10-26 ENCOUNTER — Telehealth: Payer: Self-pay | Admitting: Family Medicine

## 2011-10-26 MED ORDER — AMPHETAMINE-DEXTROAMPHETAMINE 20 MG PO TABS: 20.0000 mg | ORAL_TABLET | Freq: Three times a day (TID) | ORAL | Status: DC

## 2011-10-26 NOTE — Telephone Encounter (Signed)
Left a message for pt that rx is ready for pick up.  

## 2011-10-26 NOTE — Telephone Encounter (Signed)
Pt need new rx generic adderall 20 mg. Pt would like 3 separate rx for 3 months.

## 2011-10-26 NOTE — Telephone Encounter (Signed)
done

## 2011-10-26 NOTE — Telephone Encounter (Signed)
Pt last seen 04/14/11.  Rx last filled 07/24/11. Pls advise.

## 2012-01-22 ENCOUNTER — Telehealth: Payer: Self-pay | Admitting: Family Medicine

## 2012-01-22 NOTE — Telephone Encounter (Signed)
Pt called req script for amphetamine-dextroamphetamine (ADDERALL, 20MG ,) 20 MG tablet. Pt is completely out of med.

## 2012-01-23 MED ORDER — AMPHETAMINE-DEXTROAMPHETAMINE 20 MG PO TABS: 20.0000 mg | ORAL_TABLET | Freq: Three times a day (TID) | ORAL | Status: DC

## 2012-01-23 NOTE — Telephone Encounter (Signed)
done

## 2012-01-23 NOTE — Telephone Encounter (Signed)
Script is ready and I tried to call pt, no answer.

## 2012-03-19 ENCOUNTER — Telehealth: Payer: Self-pay | Admitting: Family Medicine

## 2012-03-19 NOTE — Telephone Encounter (Signed)
Pt will be going out of town and needs to get his adderal rx refilled tomorrow before he leaves. Please contact

## 2012-03-20 NOTE — Telephone Encounter (Signed)
Have him take his last rx into the pharmacy, and then call them to okay an early refill

## 2012-03-20 NOTE — Telephone Encounter (Signed)
I called pharmacy and gave the below information.

## 2012-03-20 NOTE — Telephone Encounter (Signed)
Too early for refills. The last time he got refills for 3 months. He should be able to pick up the last month supply on 03-24-12

## 2012-03-20 NOTE — Telephone Encounter (Signed)
Pt is not out of refills. Pt would like to fill one of his scripts early because he is going out of town

## 2012-04-23 ENCOUNTER — Other Ambulatory Visit: Payer: Self-pay | Admitting: Family Medicine

## 2012-04-23 MED ORDER — AMPHETAMINE-DEXTROAMPHETAMINE 20 MG PO TABS: 20.0000 mg | ORAL_TABLET | Freq: Three times a day (TID) | ORAL | Status: DC

## 2012-04-23 NOTE — Telephone Encounter (Signed)
Patient called stating that he need a refill of his adderall. Please assist.  °

## 2012-04-23 NOTE — Telephone Encounter (Signed)
Please advise 

## 2012-04-23 NOTE — Telephone Encounter (Signed)
I wrote for one month only. He needs an OV since we have not seen him in a year

## 2012-04-24 NOTE — Telephone Encounter (Signed)
Attempt to call - VM - LMTCB if questions RX ready for pick up - also needs to make rov - since it has been 1 year since seen - per dr. Clent Ridges

## 2012-05-24 ENCOUNTER — Ambulatory Visit (INDEPENDENT_AMBULATORY_CARE_PROVIDER_SITE_OTHER): Payer: Self-pay | Admitting: Family Medicine

## 2012-05-24 ENCOUNTER — Ambulatory Visit: Payer: Self-pay | Admitting: Family Medicine

## 2012-05-24 ENCOUNTER — Encounter: Payer: Self-pay | Admitting: Family Medicine

## 2012-05-24 VITALS — BP 110/78 | Temp 97.9°F | Wt 210.0 lb

## 2012-05-24 DIAGNOSIS — F909 Attention-deficit hyperactivity disorder, unspecified type: Secondary | ICD-10-CM

## 2012-05-24 LAB — CBC WITH DIFFERENTIAL/PLATELET
Basophils Relative: 0.5 % (ref 0.0–3.0)
Eosinophils Relative: 1.9 % (ref 0.0–5.0)
HCT: 49.2 % (ref 39.0–52.0)
Hemoglobin: 16.3 g/dL (ref 13.0–17.0)
Lymphs Abs: 2.5 10*3/uL (ref 0.7–4.0)
Monocytes Relative: 9.3 % (ref 3.0–12.0)
Neutro Abs: 3.9 10*3/uL (ref 1.4–7.7)
WBC: 7.3 10*3/uL (ref 4.5–10.5)

## 2012-05-24 LAB — POCT URINALYSIS DIPSTICK
Blood, UA: NEGATIVE
Glucose, UA: NEGATIVE
Spec Grav, UA: 1.02

## 2012-05-24 LAB — HEPATIC FUNCTION PANEL
ALT: 42 U/L (ref 0–53)
Alkaline Phosphatase: 33 U/L — ABNORMAL LOW (ref 39–117)
Bilirubin, Direct: 0.1 mg/dL (ref 0.0–0.3)
Total Bilirubin: 0.7 mg/dL (ref 0.3–1.2)

## 2012-05-24 LAB — TSH: TSH: 0.48 u[IU]/mL (ref 0.35–5.50)

## 2012-05-24 LAB — BASIC METABOLIC PANEL
Calcium: 8.9 mg/dL (ref 8.4–10.5)
Chloride: 102 mEq/L (ref 96–112)
Creatinine, Ser: 1.1 mg/dL (ref 0.4–1.5)
Sodium: 137 mEq/L (ref 135–145)

## 2012-05-24 MED ORDER — AMPHETAMINE-DEXTROAMPHETAMINE 20 MG PO TABS: 20.0000 mg | ORAL_TABLET | Freq: Three times a day (TID) | ORAL | Status: DC

## 2012-05-24 NOTE — Progress Notes (Signed)
  Subjective:    Patient ID: Shane Reese, male    DOB: 1965-11-02, 46 y.o.   MRN: [id number]  HPI Here to follow up on ADHD. He has been feeling well and has no concerns. He notes that he recently switched from Dr. Luberta Mutter to the [facility] for his Suboxone, and they have him on an aggressive weaning program to het him off this in 9 weeks. He is very excited about this since he has been on Suboxone for 2 years now.    Review of Systems  Constitutional: Negative.   Respiratory: Negative.   Cardiovascular: Negative.   Neurological: Negative.   Psychiatric/Behavioral: Negative.        Objective:   Physical Exam  Constitutional: He is oriented to person, place, and time. He appears well-developed and well-nourished.  Cardiovascular: Normal rate, regular rhythm, normal heart sounds and intact distal pulses.   Pulmonary/Chest: Effort normal and breath sounds normal.  Neurological: He is alert and oriented to person, place, and time.  Psychiatric: He has a normal mood and affect. His behavior is normal. Thought content normal.          Assessment & Plan:  Refilled Adderall for 3 months. Get labs today.

## 2012-05-27 ENCOUNTER — Encounter: Payer: Self-pay | Admitting: Family Medicine

## 2012-05-27 ENCOUNTER — Telehealth: Payer: Self-pay | Admitting: Family Medicine

## 2012-05-27 NOTE — Telephone Encounter (Signed)
I spoke with Shane Reese and asked if there was anything that I could help with and he declined. He was just taking a chance that Dr. Clent Ridges might be able to speak with him and I explained that you were in with Shane Reese's. He said that is okay and he understood.

## 2012-05-27 NOTE — Telephone Encounter (Signed)
Pt called and is req a call back from Dr Clent Ridges re: the new med pt was put on at last ov.

## 2012-06-05 ENCOUNTER — Ambulatory Visit: Payer: Self-pay | Admitting: Family Medicine

## 2012-06-10 ENCOUNTER — Ambulatory Visit: Payer: Self-pay | Admitting: Family Medicine

## 2012-06-10 DIAGNOSIS — Z0289 Encounter for other administrative examinations: Secondary | ICD-10-CM

## 2012-06-19 ENCOUNTER — Encounter: Payer: Self-pay | Admitting: Family Medicine

## 2012-06-19 ENCOUNTER — Ambulatory Visit (INDEPENDENT_AMBULATORY_CARE_PROVIDER_SITE_OTHER): Payer: 59 | Admitting: Family Medicine

## 2012-06-19 VITALS — BP 120/82 | HR 83 | Temp 98.5°F | Wt 203.0 lb

## 2012-06-19 DIAGNOSIS — M545 Low back pain, unspecified: Secondary | ICD-10-CM

## 2012-06-19 DIAGNOSIS — Z23 Encounter for immunization: Secondary | ICD-10-CM

## 2012-06-19 MED ORDER — OXYCODONE-ACETAMINOPHEN 10-650 MG PO TABS: 1.0000 | ORAL_TABLET | Freq: Four times a day (QID) | ORAL | Status: DC | PRN

## 2012-06-19 NOTE — Progress Notes (Signed)
  Subjective:    Patient ID: Shane Reese, male    DOB: December 03, 1965, 46 y.o.   MRN: [id number]  HPI Here for some back pain after a weekend of playing football with his sons. The pain is in the lower back, no radiation to the legs. Using Motrin and heat.    Review of Systems  Constitutional: Negative.   Musculoskeletal: Positive for back pain.       Objective:   Physical Exam  Constitutional: He appears well-developed and well-nourished.  Musculoskeletal: Normal range of motion.       Tender in the lower back           Assessment & Plan:  Rest, stretches. Recheck prn

## 2012-08-21 ENCOUNTER — Other Ambulatory Visit: Payer: Self-pay | Admitting: Family Medicine

## 2012-08-21 NOTE — Telephone Encounter (Signed)
Pt needs refill of ADDERALL 20 mg. (90 day supply) Pt also needs refill of LORazepam 1mg  sent to [facility].

## 2012-08-22 MED ORDER — LORAZEPAM 1 MG PO TABS: 1.0000 mg | ORAL_TABLET | Freq: Four times a day (QID) | ORAL | Status: DC | PRN

## 2012-08-22 MED ORDER — AMPHETAMINE-DEXTROAMPHETAMINE 20 MG PO TABS: 20.0000 mg | ORAL_TABLET | Freq: Three times a day (TID) | ORAL | Status: DC

## 2012-08-22 NOTE — Addendum Note (Signed)
Addended by: Aniceto Boss A on: 08/22/2012 09:18 AM   Modules accepted: Orders

## 2012-08-22 NOTE — Telephone Encounter (Signed)
I called in 1 script and the other is ready for pick up. I tried to reach pt, no answer.

## 2012-08-22 NOTE — Telephone Encounter (Signed)
Done. Also call in Lorazepam #60 with 5 rf

## 2012-08-22 NOTE — Addendum Note (Signed)
Addended by: Gershon Crane A on: 08/22/2012 08:43 AM   Modules accepted: Orders

## 2012-11-19 ENCOUNTER — Telehealth: Payer: Self-pay | Admitting: Family Medicine

## 2012-11-19 MED ORDER — AMPHETAMINE-DEXTROAMPHETAMINE 20 MG PO TABS: 20.0000 mg | ORAL_TABLET | Freq: Three times a day (TID) | ORAL | Status: DC

## 2012-11-19 NOTE — Telephone Encounter (Signed)
Pt needs new rx generic adderall 20 mg three times daily

## 2012-11-19 NOTE — Telephone Encounter (Signed)
Script is ready for pick up, tried to reach pt and no answer.  

## 2012-11-19 NOTE — Telephone Encounter (Signed)
done

## 2012-12-20 ENCOUNTER — Telehealth: Payer: Self-pay | Admitting: Family Medicine

## 2012-12-20 NOTE — Telephone Encounter (Signed)
Pt would like to pick up rxs today

## 2012-12-20 NOTE — Telephone Encounter (Signed)
Pt was doing spring cleaning yesterday and accidentally through away 2 rx adderall. Pt would like to pick up another rx for adderall

## 2012-12-20 NOTE — Telephone Encounter (Signed)
PT called inquiring about his amphetamine-dextroamphetamine (ADDERALL) 20 MG tablet RX that was thrown away. He states that he is going out of town, so he'd like to replace it shortly. Please assist.

## 2012-12-23 MED ORDER — AMPHETAMINE-DEXTROAMPHETAMINE 20 MG PO TABS: 20.0000 mg | ORAL_TABLET | Freq: Three times a day (TID) | ORAL | Status: DC

## 2012-12-23 NOTE — Telephone Encounter (Signed)
done

## 2012-12-23 NOTE — Telephone Encounter (Signed)
Script is ready for pick up and I left message. 

## 2013-02-21 ENCOUNTER — Telehealth: Payer: Self-pay | Admitting: Family Medicine

## 2013-02-21 NOTE — Telephone Encounter (Signed)
Pt called to request a 3 month supply of his amphetamine-dextroamphetamine (ADDERALL) 20 MG tablet. Please assist.

## 2013-02-24 MED ORDER — AMPHETAMINE-DEXTROAMPHETAMINE 20 MG PO TABS: 20.0000 mg | ORAL_TABLET | Freq: Three times a day (TID) | ORAL | Status: DC

## 2013-02-24 NOTE — Telephone Encounter (Signed)
done

## 2013-02-24 NOTE — Telephone Encounter (Signed)
Script is ready for pick up and I tried to reach pt, no answer.  

## 2013-05-22 ENCOUNTER — Telehealth: Payer: Self-pay | Admitting: Family Medicine

## 2013-05-22 NOTE — Telephone Encounter (Signed)
Pt needs new rx generic adderall 20 mg three times a day °

## 2013-05-23 MED ORDER — AMPHETAMINE-DEXTROAMPHETAMINE 20 MG PO TABS: 20.0000 mg | ORAL_TABLET | Freq: Three times a day (TID) | ORAL | Status: DC

## 2013-05-23 NOTE — Telephone Encounter (Signed)
Script is ready for pick up, tried to reach pt and no answer.  

## 2013-05-23 NOTE — Telephone Encounter (Signed)
done

## 2013-08-11 ENCOUNTER — Telehealth: Payer: Self-pay | Admitting: Family Medicine

## 2013-08-11 MED ORDER — AMPHETAMINE-DEXTROAMPHETAMINE 20 MG PO TABS: 20.0000 mg | ORAL_TABLET | Freq: Three times a day (TID) | ORAL | Status: DC

## 2013-08-11 NOTE — Telephone Encounter (Signed)
I did write for an 11 day supply but he needs to sign a contract and have testing when he comes in

## 2013-08-11 NOTE — Telephone Encounter (Signed)
Pt went out of town this past weekend and left his pouch which had his adderoll and reading glasses in it, pt states the hotel cannot find it.  Pt wants to know can he get a rx with enough pills to last him until the 13th of this month? Please advise.

## 2013-08-12 NOTE — Telephone Encounter (Signed)
Script is ready for pick up, contract printed and left a voice message for pt. 

## 2013-08-13 ENCOUNTER — Telehealth: Payer: Self-pay | Admitting: Family Medicine

## 2013-08-13 NOTE — Telephone Encounter (Signed)
I called pharmacy and left a voice message with the okay to fill the script, see previous phone note.

## 2013-08-13 NOTE — Telephone Encounter (Signed)
Please call pt's pharmacy approving his adderoll refill. Thank you.

## 2013-08-21 ENCOUNTER — Telehealth: Payer: Self-pay | Admitting: Family Medicine

## 2013-08-21 MED ORDER — AMPHETAMINE-DEXTROAMPHETAMINE 20 MG PO TABS: 20.0000 mg | ORAL_TABLET | Freq: Three times a day (TID) | ORAL | Status: DC

## 2013-08-21 NOTE — Telephone Encounter (Signed)
done

## 2013-08-21 NOTE — Telephone Encounter (Signed)
Pt need new rx generic adderall 20 mg #90

## 2013-08-22 NOTE — Telephone Encounter (Signed)
Script is ready for pick up and pt is here to pick up. 

## 2013-09-12 ENCOUNTER — Encounter: Payer: Self-pay | Admitting: Family Medicine

## 2013-11-19 ENCOUNTER — Telehealth: Payer: Self-pay | Admitting: Family Medicine

## 2013-11-19 MED ORDER — AMPHETAMINE-DEXTROAMPHETAMINE 20 MG PO TABS: 20.0000 mg | ORAL_TABLET | Freq: Three times a day (TID) | ORAL | Status: DC

## 2013-11-19 NOTE — Telephone Encounter (Signed)
Pt is needing new rx amphetamine-dextroamphetamine (ADDERALL) 20 MG tablet, please call when available for pick up. ° °

## 2013-11-19 NOTE — Telephone Encounter (Signed)
Done for one month only. He needs an OV soon.  

## 2013-11-20 NOTE — Telephone Encounter (Signed)
Script is ready for pick up, spoke with pt and he will schedule the office visit.

## 2013-12-15 ENCOUNTER — Telehealth: Payer: Self-pay | Admitting: Family Medicine

## 2013-12-15 NOTE — Telephone Encounter (Signed)
Pt is needing new rx for amphetamine-dextroamphetamine (ADDERALL) 20 MG tablet please call when available for pick up. Pt states he has appt scheduled next week.

## 2013-12-16 MED ORDER — AMPHETAMINE-DEXTROAMPHETAMINE 20 MG PO TABS: 20.0000 mg | ORAL_TABLET | Freq: Three times a day (TID) | ORAL | Status: DC

## 2013-12-16 NOTE — Telephone Encounter (Signed)
Can rx  14 days supply  Or wait until  Dr Clent RidgesFry can rx  More quantity

## 2013-12-16 NOTE — Telephone Encounter (Signed)
Pt fu on refill request. Pt states 14 days is ok, he is going out of town and cannot go w/out meds. Will keep appt w/ dr Clent Ridgesfry

## 2013-12-16 NOTE — Telephone Encounter (Signed)
Script is ready for pick up and I spoke with pt.  

## 2013-12-22 ENCOUNTER — Encounter: Payer: Self-pay | Admitting: Family Medicine

## 2013-12-22 ENCOUNTER — Ambulatory Visit (INDEPENDENT_AMBULATORY_CARE_PROVIDER_SITE_OTHER): Payer: BC Managed Care – PPO | Admitting: Family Medicine

## 2013-12-22 VITALS — BP 130/80 | HR 105 | Temp 98.5°F | Ht 74.0 in | Wt 204.0 lb

## 2013-12-22 DIAGNOSIS — F909 Attention-deficit hyperactivity disorder, unspecified type: Secondary | ICD-10-CM

## 2013-12-22 DIAGNOSIS — R590 Localized enlarged lymph nodes: Secondary | ICD-10-CM | POA: Insufficient documentation

## 2013-12-22 DIAGNOSIS — R599 Enlarged lymph nodes, unspecified: Secondary | ICD-10-CM

## 2013-12-22 HISTORY — DX: Localized enlarged lymph nodes: R59.0

## 2013-12-22 MED ORDER — AMPHETAMINE-DEXTROAMPHETAMINE 10 MG PO TABS
20.0000 mg | ORAL_TABLET | Freq: Three times a day (TID) | ORAL | Status: DC
Start: 2013-12-22 — End: 2013-12-22

## 2013-12-22 MED ORDER — AMPHETAMINE-DEXTROAMPHETAMINE 10 MG PO TABS: 20.0000 mg | ORAL_TABLET | Freq: Three times a day (TID) | ORAL | Status: DC

## 2013-12-22 MED ORDER — AMOXICILLIN-POT CLAVULANATE 875-125 MG PO TABS: 1.0000 | ORAL_TABLET | Freq: Two times a day (BID) | ORAL | Status: DC

## 2013-12-22 NOTE — Progress Notes (Signed)
   Subjective:    Patient ID: Shane Reese, male    DOB: 12/15/1965, 48 y.o.   MRN: [id number]  HPI Here to follow up on ADHD but also with a new problem. He is very happy with the Adderall he has taken for several years but he would like to try cutting back on the dose. He wants to try 10 mg pills instead of 20 mg. Also 2 weeks ago he had the sudden onset of knots swelling in the left neck area. No ST or sinus issues. No fevers. No dental pain but he has long standing dental problems.    Review of Systems  Constitutional: Negative.   Neurological: Negative.   Hematological: Positive for adenopathy.  Psychiatric/Behavioral: Negative.        Objective:   Physical Exam  Constitutional: He is oriented to person, place, and time. He appears well-developed and well-nourished.  HENT:  Right Ear: External ear normal.  Left Ear: External ear normal.  Nose: Nose normal.  Mouth/Throat: Oropharynx is clear and moist.  He has multiple broken off teeth or teeth eroded down to the gum lines. No swelling or tenderness  Eyes: Conjunctivae are normal.  Neck: Neck supple. No thyromegaly present.  He has 3 or 4 large firm well defined masses along the left sternomastoid chain, not tender   Neurological: He is alert and oriented to person, place, and time.  Psychiatric: He has a normal mood and affect. His behavior is normal. Thought content normal.          Assessment & Plan:  Refilled Adderall but switched to 10 mg tablets so he has the flexibility to reduce the dose if he chooses. The cervical lymph nodes are probably reacting to the dental disease so we will treat with Augmentin for 14 days. Recheck in 14 days and if the nodes have not resolved to some degree, we will need to either work them up with a CT of the neck area or refer to ENT for a biopsy

## 2013-12-22 NOTE — Progress Notes (Signed)
Pre visit review using our clinic review tool, if applicable. No additional management support is needed unless otherwise documented below in the visit note. 

## 2013-12-29 ENCOUNTER — Telehealth: Payer: Self-pay | Admitting: Family Medicine

## 2013-12-29 NOTE — Telephone Encounter (Signed)
First of all, that makes no sense because we have been giving him 20 mg of Adderall for years and they have been covering this with no questions asked. I suppose we could go with 10 mg tabs to take 4 a day but please ask the patient what he would like to do.

## 2013-12-29 NOTE — Telephone Encounter (Signed)
[facility] Adderall 10 mg, Take 2 tablets (20 mg total) by mouth 3 (three) times daily.  The denial states the maximum dose for treatment of ADHD is 40 mg per day.  Coverage is limited to 2 tablets/day of all strengths except 20 mg (which may be approved up to 3 tablets/day for treatment of narcolepsy).  Quantities above this may be eligible for coverage when the physician submits documentation in support of therapy with a higher dose for the intended diagnosis, including documentation that shows the length of time the requested dose has been used and what other drugs and doses have been tried and failed.  Please advise if you would like the change the RX or submit documentation for an appeal.

## 2013-12-30 MED ORDER — AMPHETAMINE-DEXTROAMPHETAMINE 10 MG PO TABS: 20.0000 mg | ORAL_TABLET | Freq: Two times a day (BID) | ORAL | Status: DC

## 2013-12-30 NOTE — Telephone Encounter (Signed)
Scripts are ready for pick up and I spoke with pt. He will bring back in the old scripts.

## 2013-12-30 NOTE — Telephone Encounter (Signed)
Can you make sure that insurance will cover Adderall 10 mg take 4 po qd? I did speak with pt and it is okay with him, just send the note back and we will write new script.

## 2013-12-30 NOTE — Telephone Encounter (Signed)
Done but ask him to bring in the original 3 scripts so we can shred these

## 2014-01-06 ENCOUNTER — Ambulatory Visit (INDEPENDENT_AMBULATORY_CARE_PROVIDER_SITE_OTHER): Payer: BC Managed Care – PPO | Admitting: Family Medicine

## 2014-01-06 ENCOUNTER — Encounter: Payer: Self-pay | Admitting: Family Medicine

## 2014-01-06 VITALS — BP 134/74 | HR 95 | Temp 98.1°F | Ht 74.0 in | Wt 200.0 lb

## 2014-01-06 DIAGNOSIS — K0510 Chronic gingivitis, plaque induced: Secondary | ICD-10-CM | POA: Insufficient documentation

## 2014-01-06 DIAGNOSIS — R590 Localized enlarged lymph nodes: Secondary | ICD-10-CM

## 2014-01-06 DIAGNOSIS — R599 Enlarged lymph nodes, unspecified: Secondary | ICD-10-CM

## 2014-01-06 DIAGNOSIS — F909 Attention-deficit hyperactivity disorder, unspecified type: Secondary | ICD-10-CM

## 2014-01-06 HISTORY — DX: Localized enlarged lymph nodes: R59.0

## 2014-01-06 MED ORDER — AMOXICILLIN-POT CLAVULANATE 875-125 MG PO TABS: 1.0000 | ORAL_TABLET | Freq: Two times a day (BID) | ORAL | Status: DC

## 2014-01-06 MED ORDER — AMPHETAMINE-DEXTROAMPHETAMINE 20 MG PO TABS: 20.0000 mg | ORAL_TABLET | Freq: Two times a day (BID) | ORAL | Status: DC

## 2014-01-06 MED ORDER — OXYCODONE-ACETAMINOPHEN 10-325 MG PO TABS: 1.0000 | ORAL_TABLET | Freq: Four times a day (QID) | ORAL | Status: DC | PRN

## 2014-01-06 MED ORDER — AMPHETAMINE-DEXTROAMPHETAMINE 20 MG PO TABS
20.0000 mg | ORAL_TABLET | Freq: Two times a day (BID) | ORAL | Status: DC
Start: 2014-01-06 — End: 2014-04-03

## 2014-01-06 NOTE — Progress Notes (Signed)
Pre visit review using our clinic review tool, if applicable. No additional management support is needed unless otherwise documented below in the visit note. 

## 2014-01-06 NOTE — Progress Notes (Signed)
   Subjective:    Patient ID: Shane Reese, male    DOB: 02/22/1966, 48 y.o.   MRN: [id number]  HPI Here to follow up on left sided anterior cervical adenopathy. He has extensive dental disease and we felt his nodes were reacting to this. He has been on Augmentin for 2 weeks and in fact the nodes are much smaller. We have had to change his Adderall rx several times to meet with insurance company specifications.    Review of Systems  Constitutional: Negative.   HENT: Positive for dental problem.        Objective:   Physical Exam  Constitutional: He appears well-developed and well-nourished.  Neck:  Left anterior cervical nodes are about 50% decreased in size from the last visit           Assessment & Plan:  Reactive lymph nodes which are responding to antibiotic treatment. We will give him Augmentin for 60 more days. He is trying to get dental insurance so he can take care of his dental issues

## 2014-03-20 ENCOUNTER — Telehealth: Payer: Self-pay | Admitting: Family Medicine

## 2014-03-20 NOTE — Telephone Encounter (Signed)
Pt is needing new rx oxyCODONE-acetaminophen (PERCOCET) 10-325 MG per tablet. Please call when available for pick up. ° °

## 2014-03-25 ENCOUNTER — Telehealth: Payer: Self-pay | Admitting: Family Medicine

## 2014-03-25 MED ORDER — OXYCODONE-ACETAMINOPHEN 10-325 MG PO TABS: 1.0000 | ORAL_TABLET | Freq: Four times a day (QID) | ORAL | Status: DC | PRN

## 2014-03-25 NOTE — Telephone Encounter (Signed)
I spoke with pharmacy, they just had to verify script [place]

## 2014-03-25 NOTE — Telephone Encounter (Signed)
Script is ready and pt is now for pick up.

## 2014-03-25 NOTE — Telephone Encounter (Signed)
Pharm needs to verify pt's  oxyCODONE-acetaminophen (PERCOCET) 10-325 MG per tablet rx  pls call

## 2014-03-25 NOTE — Telephone Encounter (Signed)
done

## 2014-03-25 NOTE — Telephone Encounter (Signed)
Pt is going out of town and needs new oxycodone.

## 2014-04-02 ENCOUNTER — Telehealth: Payer: Self-pay | Admitting: Family Medicine

## 2014-04-02 NOTE — Telephone Encounter (Signed)
Pt needs new rx generic adderall 20 mg °

## 2014-04-03 MED ORDER — AMPHETAMINE-DEXTROAMPHETAMINE 20 MG PO TABS: 20.0000 mg | ORAL_TABLET | Freq: Two times a day (BID) | ORAL | Status: DC

## 2014-04-03 NOTE — Telephone Encounter (Signed)
done

## 2014-04-03 NOTE — Telephone Encounter (Signed)
Script is ready for pick up and I left a voice message for pt. 

## 2014-05-04 ENCOUNTER — Encounter: Payer: Self-pay | Admitting: Family Medicine

## 2014-05-04 ENCOUNTER — Ambulatory Visit (INDEPENDENT_AMBULATORY_CARE_PROVIDER_SITE_OTHER): Payer: BC Managed Care – PPO | Admitting: Family Medicine

## 2014-05-04 ENCOUNTER — Ambulatory Visit: Payer: BC Managed Care – PPO | Admitting: Family Medicine

## 2014-05-04 ENCOUNTER — Telehealth: Payer: Self-pay | Admitting: Family Medicine

## 2014-05-04 VITALS — BP 122/71 | HR 98 | Temp 98.6°F | Ht 74.0 in | Wt 195.0 lb

## 2014-05-04 DIAGNOSIS — N390 Urinary tract infection, site not specified: Secondary | ICD-10-CM

## 2014-05-04 DIAGNOSIS — F909 Attention-deficit hyperactivity disorder, unspecified type: Secondary | ICD-10-CM

## 2014-05-04 LAB — POCT URINALYSIS DIPSTICK
Bilirubin, UA: NEGATIVE
Blood, UA: NEGATIVE
GLUCOSE UA: NEGATIVE
Ketones, UA: NEGATIVE
Leukocytes, UA: NEGATIVE
Nitrite, UA: NEGATIVE
Spec Grav, UA: 1.02
Urobilinogen, UA: 1
pH, UA: 5.5

## 2014-05-04 MED ORDER — AMPHETAMINE-DEXTROAMPHETAMINE 20 MG PO TABS: 20.0000 mg | ORAL_TABLET | Freq: Three times a day (TID) | ORAL | Status: DC

## 2014-05-04 MED ORDER — SULFAMETHOXAZOLE-TMP DS 800-160 MG PO TABS: 1.0000 | ORAL_TABLET | Freq: Two times a day (BID) | ORAL | Status: DC

## 2014-05-04 NOTE — Telephone Encounter (Signed)
Pt came in this afternoon, there was a mix up this morning, per Dr. Clent Ridges, no charge.

## 2014-05-04 NOTE — Progress Notes (Signed)
Pre visit review using our clinic review tool, if applicable. No additional management support is needed unless otherwise documented below in the visit note. 

## 2014-05-04 NOTE — Progress Notes (Signed)
   Subjective:    Patient ID: Shane Reese, male    DOB: Oct 16, 1965, 48 y.o.   MRN: [id number]  HPI Here for 2 things. First he asks to go back up to taking Adderall TID instead of BID. He seems to run out in the evenings and it is hard to focus. Also for 2 weeks he has had urgency to urinate with some low back pain. No urethral DC or fever. Has taken a few days of Cipro he had at home with no improvement.    Review of Systems  Constitutional: Negative.   Gastrointestinal: Negative.   Genitourinary: Positive for urgency and frequency. Negative for hematuria, discharge and testicular pain.       Objective:   Physical Exam  Constitutional: He is oriented to person, place, and time. He appears well-developed and well-nourished.  Abdominal: Soft. Bowel sounds are normal.  Neurological: He is alert and oriented to person, place, and time.  Psychiatric: He has a normal mood and affect. His behavior is normal. Thought content normal.          Assessment & Plan:  This is most likely a prostatitis. Culture the urine. Treat with 14 days of Bactrim DS. Increase the Adderall back to TID.

## 2014-05-04 NOTE — Telephone Encounter (Signed)
Pt was on schedule today and missed appointment, left a voice message for pt to reschedule.

## 2014-05-05 LAB — GC/CHLAMYDIA PROBE AMP, URINE
CHLAMYDIA, SWAB/URINE, PCR: NEGATIVE
GC PROBE AMP, URINE: NEGATIVE

## 2014-05-27 ENCOUNTER — Telehealth: Payer: Self-pay | Admitting: Family Medicine

## 2014-05-27 MED ORDER — AMPHETAMINE-DEXTROAMPHETAMINE 20 MG PO TABS: 20.0000 mg | ORAL_TABLET | Freq: Three times a day (TID) | ORAL | Status: DC

## 2014-05-27 NOTE — Telephone Encounter (Signed)
I will write for one month only. He should still have the rx for October available also

## 2014-05-27 NOTE — Telephone Encounter (Signed)
Pt said he threw away his amphetamine-dextroamphetamine (ADDERALL) 20 MG tablet by mistake and is requesting a rx

## 2014-05-28 NOTE — Telephone Encounter (Signed)
Script is ready for pick up and I left a voice message for pt. 

## 2014-06-23 ENCOUNTER — Telehealth: Payer: Self-pay | Admitting: Family Medicine

## 2014-06-23 MED ORDER — OXYCODONE-ACETAMINOPHEN 10-325 MG PO TABS: 1.0000 | ORAL_TABLET | Freq: Four times a day (QID) | ORAL | Status: DC | PRN

## 2014-06-23 MED ORDER — AMPHETAMINE-DEXTROAMPHETAMINE 20 MG PO TABS: 20.0000 mg | ORAL_TABLET | Freq: Three times a day (TID) | ORAL | Status: DC

## 2014-06-23 NOTE — Telephone Encounter (Signed)
Scripts are ready for pick up and I tried to reach pt, no answer.

## 2014-06-23 NOTE — Telephone Encounter (Signed)
Pt needs re-fills on oxyCODONE-acetaminophen (PERCOCET) 10-325 MG per tablet and amphetamine-dextroamphetamine (ADDERALL) 20 MG tablet

## 2014-06-23 NOTE — Telephone Encounter (Signed)
done

## 2014-06-25 ENCOUNTER — Telehealth: Payer: Self-pay | Admitting: Family Medicine

## 2014-07-28 ENCOUNTER — Other Ambulatory Visit: Payer: Self-pay | Admitting: Family Medicine

## 2014-07-28 NOTE — Telephone Encounter (Signed)
Pt request refill of the following: amphetamine-dextroamphetamine (ADDERALL) 20 MG tablet ° ° °Phamacy: ° °

## 2014-07-29 MED ORDER — AMPHETAMINE-DEXTROAMPHETAMINE 20 MG PO TABS
20.0000 mg | ORAL_TABLET | Freq: Three times a day (TID) | ORAL | Status: DC
Start: 2014-07-29 — End: 2014-09-03

## 2014-07-29 NOTE — Telephone Encounter (Signed)
done

## 2014-07-29 NOTE — Telephone Encounter (Signed)
Left a message for pt making aware rx ready for pick up.  

## 2014-08-18 ENCOUNTER — Telehealth: Payer: Self-pay | Admitting: Family Medicine

## 2014-08-18 NOTE — Telephone Encounter (Signed)
Pt needs new rxs oxycodone and generic adderall 20 mg °

## 2014-08-19 ENCOUNTER — Other Ambulatory Visit: Payer: Self-pay | Admitting: Family Medicine

## 2014-08-19 MED ORDER — OXYCODONE-ACETAMINOPHEN 10-325 MG PO TABS: 1.0000 | ORAL_TABLET | Freq: Four times a day (QID) | ORAL | Status: DC | PRN

## 2014-08-19 NOTE — Telephone Encounter (Signed)
Wrote for [employer] but he is TOO early for Adderall (not until 08-28-14)

## 2014-08-19 NOTE — Telephone Encounter (Signed)
Pt is here now to pick up and aware that it is too early.

## 2014-08-20 NOTE — Telephone Encounter (Signed)
Call in #60 with 2 rf 

## 2014-08-21 NOTE — Telephone Encounter (Signed)
Called to the pharmacy and left on machine. 

## 2014-09-02 ENCOUNTER — Telehealth: Payer: Self-pay | Admitting: Family Medicine

## 2014-09-02 NOTE — Telephone Encounter (Signed)
Pt request refill amphetamine-dextroamphetamine (ADDERALL) 20 MG tablet ° °

## 2014-09-03 MED ORDER — AMPHETAMINE-DEXTROAMPHETAMINE 20 MG PO TABS: 20.0000 mg | ORAL_TABLET | Freq: Three times a day (TID) | ORAL | Status: DC

## 2014-09-03 NOTE — Telephone Encounter (Signed)
done

## 2014-09-03 NOTE — Telephone Encounter (Signed)
Script is ready for pick up and I left a voice message for pt. 

## 2014-09-28 ENCOUNTER — Telehealth: Payer: Self-pay | Admitting: Family Medicine

## 2014-09-28 NOTE — Telephone Encounter (Signed)
Pt needs new rxs oxycodone and generic adderall 20 mg. Pt is call early. Pt has new job and will be out of town for 3 wks

## 2014-09-30 MED ORDER — AMPHETAMINE-DEXTROAMPHETAMINE 20 MG PO TABS: 20.0000 mg | ORAL_TABLET | Freq: Three times a day (TID) | ORAL | Status: DC

## 2014-09-30 MED ORDER — OXYCODONE-ACETAMINOPHEN 10-325 MG PO TABS: 1.0000 | ORAL_TABLET | Freq: Four times a day (QID) | ORAL | Status: DC | PRN

## 2014-09-30 NOTE — Telephone Encounter (Signed)
done

## 2014-09-30 NOTE — Telephone Encounter (Signed)
Script is ready for pick up and I left a voice message for pt. 

## 2014-10-27 ENCOUNTER — Telehealth: Payer: Self-pay | Admitting: Family Medicine

## 2014-10-27 MED ORDER — AMPHETAMINE-DEXTROAMPHETAMINE 20 MG PO TABS: 20.0000 mg | ORAL_TABLET | Freq: Three times a day (TID) | ORAL | Status: DC

## 2014-10-27 NOTE — Telephone Encounter (Signed)
Pt needs new rx generic adderall 20 mg °

## 2014-10-27 NOTE — Telephone Encounter (Signed)
Script is ready for pick up, tried to reach pt and no answer.  

## 2014-10-27 NOTE — Telephone Encounter (Signed)
done

## 2014-11-23 ENCOUNTER — Telehealth: Payer: Self-pay | Admitting: Family Medicine

## 2014-11-23 NOTE — Telephone Encounter (Signed)
Pt request refill of the following: amphetamine-dextroamphetamine (ADDERALL) 20 MG tablet ° ° °Phamacy: ° °

## 2014-11-25 MED ORDER — AMPHETAMINE-DEXTROAMPHETAMINE 20 MG PO TABS: 20.0000 mg | ORAL_TABLET | Freq: Three times a day (TID) | ORAL | Status: DC

## 2014-11-25 NOTE — Telephone Encounter (Signed)
done

## 2014-12-02 ENCOUNTER — Telehealth: Payer: Self-pay | Admitting: Family Medicine

## 2014-12-02 NOTE — Telephone Encounter (Signed)
Rx last filled 1.20.2016 and pt last seen 8.4.2015

## 2014-12-02 NOTE — Telephone Encounter (Signed)
Patient is requesting re-fill on oxyCODONE-acetaminophen (PERCOCET) 10-325 MG per tablet °

## 2014-12-03 MED ORDER — OXYCODONE-ACETAMINOPHEN 10-325 MG PO TABS
1.0000 | ORAL_TABLET | Freq: Four times a day (QID) | ORAL | Status: DC | PRN
Start: 2014-12-03 — End: 2015-01-29

## 2014-12-03 NOTE — Telephone Encounter (Signed)
done

## 2014-12-03 NOTE — Telephone Encounter (Signed)
Called and left message for pt; advised that office is closed on Good Friday

## 2014-12-11 ENCOUNTER — Telehealth: Payer: Self-pay | Admitting: Family Medicine

## 2014-12-11 NOTE — Telephone Encounter (Signed)
Pt needs new rx generic adderall 20 mg was in car that was total and pt unable to retrieve med.

## 2014-12-15 NOTE — Telephone Encounter (Signed)
Per Dr. Clent RidgesFry we cannot replace script until due date. Left a voice message for pt with this information.

## 2014-12-24 ENCOUNTER — Telehealth: Payer: Self-pay | Admitting: Family Medicine

## 2014-12-24 NOTE — Telephone Encounter (Signed)
Patient is requesting re-fill on amphetamine-dextroamphetamine (ADDERALL) 20 MG tablet. °

## 2014-12-25 MED ORDER — AMPHETAMINE-DEXTROAMPHETAMINE 20 MG PO TABS: 20.0000 mg | ORAL_TABLET | Freq: Three times a day (TID) | ORAL | Status: DC

## 2014-12-25 NOTE — Telephone Encounter (Signed)
done

## 2015-01-21 ENCOUNTER — Telehealth: Payer: Self-pay | Admitting: Family Medicine

## 2015-01-21 MED ORDER — AMPHETAMINE-DEXTROAMPHETAMINE 20 MG PO TABS: 20.0000 mg | ORAL_TABLET | Freq: Three times a day (TID) | ORAL | Status: DC

## 2015-01-21 NOTE — Telephone Encounter (Signed)
done

## 2015-01-21 NOTE — Telephone Encounter (Signed)
Pt request refill amphetamine-dextroamphetamine (ADDERALL) 20 MG tablet ° °

## 2015-01-22 NOTE — Telephone Encounter (Signed)
Script is ready for pick up and I left a voice message.  

## 2015-01-29 ENCOUNTER — Telehealth: Payer: Self-pay | Admitting: Family Medicine

## 2015-01-29 MED ORDER — OXYCODONE-ACETAMINOPHEN 10-325 MG PO TABS: 1.0000 | ORAL_TABLET | Freq: Four times a day (QID) | ORAL | Status: DC | PRN

## 2015-01-29 NOTE — Telephone Encounter (Signed)
Pt needs a refill on his percocet.  Please call for pick up.

## 2015-01-29 NOTE — Telephone Encounter (Signed)
done

## 2015-02-01 NOTE — Telephone Encounter (Signed)
Script is ready for pick up and I left a voice message for pt. 

## 2015-02-17 ENCOUNTER — Telehealth: Payer: Self-pay | Admitting: Family Medicine

## 2015-02-17 MED ORDER — AMPHETAMINE-DEXTROAMPHETAMINE 20 MG PO TABS: 20.0000 mg | ORAL_TABLET | Freq: Three times a day (TID) | ORAL | Status: DC

## 2015-02-17 NOTE — Telephone Encounter (Signed)
done

## 2015-02-17 NOTE — Telephone Encounter (Signed)
Pt needs new rx generic adderall 20 mg °

## 2015-02-18 NOTE — Telephone Encounter (Signed)
Left message to advise pt Rx ready for pick up 

## 2015-03-04 ENCOUNTER — Telehealth: Payer: Self-pay | Admitting: Family Medicine

## 2015-03-04 NOTE — Telephone Encounter (Signed)
Pt request refill of the following  oxyCODONE-acetaminophen (PERCOCET) 10-325 MG per tablet ° ° °Phamacy: ° °

## 2015-03-08 MED ORDER — OXYCODONE-ACETAMINOPHEN 10-325 MG PO TABS: 1.0000 | ORAL_TABLET | Freq: Four times a day (QID) | ORAL | Status: DC | PRN

## 2015-03-08 NOTE — Telephone Encounter (Signed)
Script is ready for pick up and I left a voice message.  

## 2015-03-08 NOTE — Telephone Encounter (Signed)
done

## 2015-03-19 ENCOUNTER — Telehealth: Payer: Self-pay | Admitting: Family Medicine

## 2015-03-19 NOTE — Telephone Encounter (Signed)
Pt request refill of the following: amphetamine-dextroamphetamine (ADDERALL) 20 MG tablet ° ° °Phamacy: ° °

## 2015-03-22 NOTE — Telephone Encounter (Signed)
Pt called a second time asking if rx had been written yet. Pt was told that the rx hadn't been written yet, but he would be notified as soon as it was.

## 2015-03-22 NOTE — Telephone Encounter (Signed)
Pt is aware waiting on md °

## 2015-03-23 MED ORDER — AMPHETAMINE-DEXTROAMPHETAMINE 20 MG PO TABS: 20.0000 mg | ORAL_TABLET | Freq: Three times a day (TID) | ORAL | Status: DC

## 2015-03-23 NOTE — Telephone Encounter (Signed)
done

## 2015-04-13 ENCOUNTER — Telehealth: Payer: Self-pay | Admitting: Family Medicine

## 2015-04-13 MED ORDER — OXYCODONE-ACETAMINOPHEN 10-325 MG PO TABS: 1.0000 | ORAL_TABLET | Freq: Four times a day (QID) | ORAL | Status: DC | PRN

## 2015-04-13 NOTE — Telephone Encounter (Signed)
done

## 2015-04-13 NOTE — Telephone Encounter (Signed)
Pt request refill of the following  oxyCODONE-acetaminophen (PERCOCET) 10-325 MG per tablet ° ° °Phamacy: ° °

## 2015-04-14 NOTE — Telephone Encounter (Signed)
Pt is aware rx ready for pick up.  

## 2015-04-19 ENCOUNTER — Telehealth: Payer: Self-pay | Admitting: Family Medicine

## 2015-04-19 NOTE — Telephone Encounter (Signed)
Pt calling to request refill of amphetamine-dextroamphetamine (ADDERALL) 20 MG tablet.  Pt informed PCP is out of the office this week, pt reports he only has enough medication to make it through tomorrow.

## 2015-04-20 MED ORDER — AMPHETAMINE-DEXTROAMPHETAMINE 20 MG PO TABS
20.0000 mg | ORAL_TABLET | Freq: Three times a day (TID) | ORAL | Status: DC
Start: 2015-04-20 — End: 2015-05-18

## 2015-04-20 NOTE — Telephone Encounter (Signed)
OK to fill for one month 

## 2015-04-20 NOTE — Telephone Encounter (Signed)
Script is ready for pick up and I left a voice message for pt. 

## 2015-04-20 NOTE — Telephone Encounter (Signed)
Last here on 05/04/14 and is due for a refill.

## 2015-05-11 ENCOUNTER — Telehealth: Payer: Self-pay | Admitting: Family Medicine

## 2015-05-11 MED ORDER — OXYCODONE-ACETAMINOPHEN 10-325 MG PO TABS: 1.0000 | ORAL_TABLET | Freq: Four times a day (QID) | ORAL | Status: DC | PRN

## 2015-05-11 NOTE — Telephone Encounter (Signed)
Pt request refill oxyCODONE-acetaminophen (PERCOCET) 10-325 MG per tablet °

## 2015-05-11 NOTE — Telephone Encounter (Signed)
done

## 2015-05-11 NOTE — Telephone Encounter (Signed)
Script is ready for pick up and I spoke with pt.  

## 2015-05-18 ENCOUNTER — Telehealth: Payer: Self-pay | Admitting: Family Medicine

## 2015-05-18 MED ORDER — AMPHETAMINE-DEXTROAMPHETAMINE 20 MG PO TABS: 20.0000 mg | ORAL_TABLET | Freq: Three times a day (TID) | ORAL | Status: DC

## 2015-05-18 NOTE — Telephone Encounter (Signed)
Pt needs new rx generic adderall 20 mg °

## 2015-05-18 NOTE — Telephone Encounter (Signed)
Script is ready and pt is here now to pick up.  

## 2015-05-18 NOTE — Telephone Encounter (Signed)
done

## 2015-06-14 ENCOUNTER — Other Ambulatory Visit: Payer: Self-pay | Admitting: Family Medicine

## 2015-06-14 NOTE — Telephone Encounter (Signed)
Pt request refill amphetamine-dextroamphetamine (ADDERALL) 20 MG tablet ° °

## 2015-06-15 MED ORDER — AMPHETAMINE-DEXTROAMPHETAMINE 20 MG PO TABS: 20.0000 mg | ORAL_TABLET | Freq: Three times a day (TID) | ORAL | Status: DC

## 2015-06-15 NOTE — Telephone Encounter (Signed)
Called and spoke with pt and pt is aware rx is ready for pick up.  

## 2015-06-15 NOTE — Telephone Encounter (Signed)
done

## 2015-06-21 ENCOUNTER — Telehealth: Payer: Self-pay | Admitting: Family Medicine

## 2015-06-21 MED ORDER — OXYCODONE-ACETAMINOPHEN 10-325 MG PO TABS: 1.0000 | ORAL_TABLET | Freq: Four times a day (QID) | ORAL | Status: DC | PRN

## 2015-06-21 NOTE — Telephone Encounter (Signed)
Script is ready for pick up and I spoke with pt.  

## 2015-06-21 NOTE — Telephone Encounter (Signed)
done

## 2015-06-21 NOTE — Telephone Encounter (Signed)
Pt needs re-fill on oxyCODONE-acetaminophen (PERCOCET) 10-325 MG per tablet ° °

## 2015-07-08 NOTE — Telephone Encounter (Signed)
Error

## 2015-07-13 ENCOUNTER — Telehealth: Payer: Self-pay | Admitting: Family Medicine

## 2015-07-13 MED ORDER — AMPHETAMINE-DEXTROAMPHETAMINE 20 MG PO TABS: 20.0000 mg | ORAL_TABLET | Freq: Three times a day (TID) | ORAL | Status: DC

## 2015-07-13 NOTE — Telephone Encounter (Signed)
done

## 2015-07-13 NOTE — Telephone Encounter (Signed)
Script is ready for pick up and I left a voice message for pt. 

## 2015-07-13 NOTE — Telephone Encounter (Signed)
Pt request refill of the following: amphetamine-dextroamphetamine (ADDERALL) 20 MG tablet ° ° °Phamacy: ° °

## 2015-07-19 ENCOUNTER — Telehealth: Payer: Self-pay | Admitting: Family Medicine

## 2015-07-19 NOTE — Telephone Encounter (Signed)
° ° ° ° °  Pt request refill of the following: ° °oxyCODONE-acetaminophen (PERCOCET) 10-325 MG tablet ° °Phamacy: °

## 2015-07-20 MED ORDER — OXYCODONE-ACETAMINOPHEN 10-325 MG PO TABS: 1.0000 | ORAL_TABLET | Freq: Four times a day (QID) | ORAL | Status: DC | PRN

## 2015-07-20 NOTE — Telephone Encounter (Signed)
Script is ready for pick up and I spoke with pt.  

## 2015-07-20 NOTE — Telephone Encounter (Signed)
done

## 2015-08-09 ENCOUNTER — Telehealth: Payer: Self-pay | Admitting: Family Medicine

## 2015-08-09 NOTE — Telephone Encounter (Signed)
Patient would like his ADDERALL medication refilled. °

## 2015-08-10 MED ORDER — AMPHETAMINE-DEXTROAMPHETAMINE 20 MG PO TABS: 20.0000 mg | ORAL_TABLET | Freq: Three times a day (TID) | ORAL | Status: DC

## 2015-08-10 NOTE — Telephone Encounter (Signed)
Script is ready for pick up and I spoke with pt.  

## 2015-08-10 NOTE — Telephone Encounter (Signed)
done

## 2015-08-16 ENCOUNTER — Telehealth: Payer: Self-pay | Admitting: Family Medicine

## 2015-08-16 NOTE — Telephone Encounter (Signed)
Pt needs new rx oxycodone °

## 2015-08-17 MED ORDER — OXYCODONE-ACETAMINOPHEN 10-325 MG PO TABS: 1.0000 | ORAL_TABLET | Freq: Four times a day (QID) | ORAL | Status: DC | PRN

## 2015-08-17 NOTE — Telephone Encounter (Signed)
Script is ready for pick up and I spoke with pt.  

## 2015-08-17 NOTE — Telephone Encounter (Signed)
done

## 2015-09-10 ENCOUNTER — Telehealth: Payer: Self-pay | Admitting: Family Medicine

## 2015-09-10 MED ORDER — AMPHETAMINE-DEXTROAMPHETAMINE 20 MG PO TABS: 20.0000 mg | ORAL_TABLET | Freq: Three times a day (TID) | ORAL | Status: DC

## 2015-09-10 NOTE — Telephone Encounter (Signed)
Script is read for pick up and I spoke with pt.

## 2015-09-10 NOTE — Telephone Encounter (Signed)
done

## 2015-09-10 NOTE — Telephone Encounter (Signed)
Mr. Shane Reese called saying he needs a refill of Adderall. Please give him a call if needed.  Pt's ph# [phone] Thank you.

## 2015-09-17 ENCOUNTER — Telehealth: Payer: Self-pay | Admitting: Family Medicine

## 2015-09-17 MED ORDER — OXYCODONE-ACETAMINOPHEN 10-325 MG PO TABS: 1.0000 | ORAL_TABLET | Freq: Four times a day (QID) | ORAL | Status: DC | PRN

## 2015-09-17 NOTE — Telephone Encounter (Signed)
Pt called to get a refill on his Oxycodone. Please advise

## 2015-09-17 NOTE — Telephone Encounter (Signed)
Pt last seen 8.24.2015.  Rx last filled on 12.06.2016

## 2015-09-17 NOTE — Telephone Encounter (Signed)
done

## 2015-09-20 NOTE — Telephone Encounter (Signed)
Script is ready for pick up and I left a voice message for pt. 

## 2015-10-08 ENCOUNTER — Telehealth: Payer: Self-pay | Admitting: Family Medicine

## 2015-10-08 MED ORDER — AMPHETAMINE-DEXTROAMPHETAMINE 20 MG PO TABS: 20.0000 mg | ORAL_TABLET | Freq: Three times a day (TID) | ORAL | Status: DC

## 2015-10-08 NOTE — Telephone Encounter (Signed)
done

## 2015-10-08 NOTE — Telephone Encounter (Signed)
Script is ready for pick up and I left a voice message for pt. 

## 2015-10-08 NOTE — Telephone Encounter (Signed)
Pt request refill amphetamine-dextroamphetamine (ADDERALL) 20 MG tablet ° °

## 2015-10-25 ENCOUNTER — Telehealth: Payer: Self-pay | Admitting: Family Medicine

## 2015-10-25 NOTE — Telephone Encounter (Signed)
Pt needs new rx oxycodone °

## 2015-10-26 MED ORDER — OXYCODONE-ACETAMINOPHEN 10-325 MG PO TABS: 1.0000 | ORAL_TABLET | Freq: Four times a day (QID) | ORAL | Status: DC | PRN

## 2015-10-26 NOTE — Telephone Encounter (Signed)
done

## 2015-10-26 NOTE — Telephone Encounter (Signed)
Script is ready for pick up and I left a voice message.  

## 2015-11-04 ENCOUNTER — Telehealth: Payer: Self-pay | Admitting: Family Medicine

## 2015-11-04 NOTE — Telephone Encounter (Signed)
Patient need a refill of Adderrall.

## 2015-11-05 MED ORDER — AMPHETAMINE-DEXTROAMPHETAMINE 20 MG PO TABS: 20.0000 mg | ORAL_TABLET | Freq: Three times a day (TID) | ORAL | Status: DC

## 2015-11-05 NOTE — Telephone Encounter (Signed)
done

## 2015-11-05 NOTE — Telephone Encounter (Signed)
Script is ready for pick up and I left a voice message.  

## 2015-11-10 ENCOUNTER — Ambulatory Visit (INDEPENDENT_AMBULATORY_CARE_PROVIDER_SITE_OTHER): Payer: 59 | Admitting: Family Medicine

## 2015-11-10 ENCOUNTER — Encounter: Payer: Self-pay | Admitting: Family Medicine

## 2015-11-10 VITALS — BP 150/80 | HR 120 | Temp 98.3°F | Ht 74.0 in | Wt 204.0 lb

## 2015-11-10 DIAGNOSIS — M545 Low back pain, unspecified: Secondary | ICD-10-CM | POA: Insufficient documentation

## 2015-11-10 DIAGNOSIS — G8929 Other chronic pain: Secondary | ICD-10-CM | POA: Insufficient documentation

## 2015-11-10 HISTORY — DX: Other chronic pain: G89.29

## 2015-11-10 HISTORY — DX: Low back pain, unspecified: M54.50

## 2015-11-10 MED ORDER — OXYCODONE HCL 15 MG PO TABS: 15.0000 mg | ORAL_TABLET | Freq: Four times a day (QID) | ORAL | Status: DC | PRN

## 2015-11-10 NOTE — Progress Notes (Signed)
   Subjective:    Patient ID: Shane Reese, male    DOB: September 29, 1965, 50 y.o.   MRN: [id number]  HPI Here asking for advice about long term pain management. He has chronic low back pain from sports injuries years ago and he has used Percocet off and on for years. He then saw several Pain Management clinics over the years and he was switched to Suboxone. He ended up taking Suboxone at various dosages for around 10 years, and at one point was taking a total of 80 mg of this daily. He was tapered down to a total of 2 mg a day around 6 months ago, but then he was increased again. However he had a difficult time with this medication, and often had withdrawal symptoms such as shakiness, headaches, and anxiety. He had been prescribed some Percocet for his back pain, but he decided to stop going to the Pain Management clinic and to stop taking Suboxone altogether. He had withdrawal symptoms and has actually been supplementing this with Percocet he has bought from friends. He is very honest about all this and says he wants to take only what we prescribe for him. He pledges to not use any other medication from any other sources. He does not use any other illicit drugs currently, nor has he for about 10 years.   Review of Systems  Constitutional: Negative.   Respiratory: Negative.   Cardiovascular: Negative.   Musculoskeletal: Positive for back pain.  Neurological: Negative.   Psychiatric/Behavioral: Negative for confusion, dysphoric mood, decreased concentration and agitation. The patient is nervous/anxious.        Objective:   Physical Exam  Constitutional: He is oriented to person, place, and time. He appears well-developed and well-nourished. No distress.  Cardiovascular: Normal rate, regular rhythm, normal heart sounds and intact distal pulses.   Pulmonary/Chest: Effort normal and breath sounds normal.  Neurological: He is alert and oriented to person, place, and time.  Psychiatric: He has a normal  mood and affect. His behavior is normal. Judgment and thought content normal.          Assessment & Plan:  He is here to get on an appropriate and supervised program of pain management and hopefully to slowly taper down on these medications. We will start at 15 mg of Oxycodone 4 times daily, and we will take the acetaminophen out for safety. Recheck in one month.

## 2015-11-26 ENCOUNTER — Telehealth: Payer: Self-pay | Admitting: Family Medicine

## 2015-11-26 NOTE — Telephone Encounter (Signed)
Pt states he was to let you know that he does not think his rx oxyCODONE (ROXICODONE) 15 MG immediate release tablet  120 tabs  Is not going to last him through until April 1.

## 2015-11-29 NOTE — Telephone Encounter (Signed)
Pt has been scheduled.  °

## 2015-11-29 NOTE — Telephone Encounter (Signed)
Have him make an OV with me to discuss this before the current supply runs out

## 2015-11-29 NOTE — Telephone Encounter (Signed)
Left message for pt to call back  °

## 2015-12-01 ENCOUNTER — Encounter: Payer: Self-pay | Admitting: Family Medicine

## 2015-12-01 ENCOUNTER — Ambulatory Visit (INDEPENDENT_AMBULATORY_CARE_PROVIDER_SITE_OTHER): Payer: 59 | Admitting: Family Medicine

## 2015-12-01 VITALS — BP 148/96 | HR 111 | Temp 98.3°F | Wt 205.0 lb

## 2015-12-01 DIAGNOSIS — M545 Low back pain: Secondary | ICD-10-CM | POA: Diagnosis not present

## 2015-12-01 DIAGNOSIS — G8929 Other chronic pain: Secondary | ICD-10-CM | POA: Diagnosis not present

## 2015-12-01 MED ORDER — OXYCODONE HCL 15 MG PO TABS: 15.0000 mg | ORAL_TABLET | ORAL | Status: DC | PRN

## 2015-12-01 MED ORDER — AMPHETAMINE-DEXTROAMPHETAMINE 20 MG PO TABS: 20.0000 mg | ORAL_TABLET | Freq: Three times a day (TID) | ORAL | Status: DC

## 2015-12-01 NOTE — Progress Notes (Signed)
Pre visit review using our clinic review tool, if applicable. No additional management support is needed unless otherwise documented below in the visit note. 

## 2015-12-01 NOTE — Progress Notes (Signed)
   Subjective:    Patient ID: Shane Reese, male    DOB: 09/10/1966, 50 y.o.   MRN: [id number]  HPI Here to follow up on our last visit where he was switched to immediate release oxycodone 15 mg to take every 6 hours for his chronic pain. He says that this dose is adequate to relieve the pain but that it runs out after a few hours.Marland Kitchen. He thinks more frequent dosing would be better. He assures me that he is only taking the medications that I prescribe and that he is not supplementing these with any other medications.    Review of Systems  Constitutional: Negative.   Respiratory: Negative.   Cardiovascular: Negative.   Musculoskeletal: Positive for back pain.  Neurological: Negative.   Psychiatric/Behavioral: Negative.        Objective:   Physical Exam  Constitutional: He is oriented to person, place, and time. He appears well-developed and well-nourished.  Neurological: He is alert and oriented to person, place, and time.  Psychiatric: He has a normal mood and affect. His behavior is normal. Thought content normal.          Assessment & Plan:  Pain management. We agreed to stay on the 15 mg dose but to increase the frequency to every 4 hours as needed. He will see me again in one month

## 2015-12-27 ENCOUNTER — Other Ambulatory Visit: Payer: Self-pay | Admitting: Family Medicine

## 2015-12-27 NOTE — Telephone Encounter (Signed)
Pt need need Rx for Oxycodone and also Rx for Ativan [facility] [address]

## 2015-12-28 MED ORDER — OXYCODONE HCL 15 MG PO TABS
15.0000 mg | ORAL_TABLET | ORAL | Status: DC | PRN
Start: 2015-12-28 — End: 2016-01-24

## 2015-12-28 MED ORDER — LORAZEPAM 1 MG PO TABS: ORAL_TABLET | ORAL | Status: DC

## 2015-12-28 NOTE — Telephone Encounter (Signed)
Rx is ready for pick up. Pt is aware  

## 2015-12-28 NOTE — Telephone Encounter (Signed)
Done, also call in Ativan #60 with 2 rf

## 2015-12-31 ENCOUNTER — Telehealth: Payer: Self-pay | Admitting: Family Medicine

## 2015-12-31 MED ORDER — AMPHETAMINE-DEXTROAMPHETAMINE 20 MG PO TABS: 20.0000 mg | ORAL_TABLET | Freq: Three times a day (TID) | ORAL | Status: DC

## 2015-12-31 NOTE — Telephone Encounter (Signed)
done

## 2015-12-31 NOTE — Telephone Encounter (Signed)
Script is ready for pick up and I left a voice message for pt. 

## 2015-12-31 NOTE — Telephone Encounter (Signed)
Pt request refill of the following: amphetamine-dextroamphetamine (ADDERALL) 20 MG tablet ° ° °Phamacy: ° °

## 2016-01-24 ENCOUNTER — Telehealth: Payer: Self-pay | Admitting: Family Medicine

## 2016-01-24 MED ORDER — AMPHETAMINE-DEXTROAMPHETAMINE 20 MG PO TABS: 20.0000 mg | ORAL_TABLET | Freq: Three times a day (TID) | ORAL | Status: DC

## 2016-01-24 MED ORDER — OXYCODONE HCL 15 MG PO TABS: 15.0000 mg | ORAL_TABLET | ORAL | Status: DC | PRN

## 2016-01-24 NOTE — Telephone Encounter (Signed)
Pt needs new rxs generic adderall 20 mg  and oxycodone

## 2016-01-24 NOTE — Telephone Encounter (Signed)
done

## 2016-01-25 NOTE — Telephone Encounter (Signed)
Script is ready for pick up and tried to reach pt, no answer. 

## 2016-02-21 ENCOUNTER — Telehealth: Payer: Self-pay | Admitting: Family Medicine

## 2016-02-21 NOTE — Telephone Encounter (Signed)
Pt needs new rx oxycodone °

## 2016-02-21 NOTE — Telephone Encounter (Signed)
Ok to refill 

## 2016-02-22 ENCOUNTER — Encounter: Payer: Self-pay | Admitting: Internal Medicine

## 2016-02-22 NOTE — Telephone Encounter (Signed)
Pt is leaving town tonight and need the rx today

## 2016-02-22 NOTE — Telephone Encounter (Signed)
Last note Dr Clent RidgesFry was in March  And was told to come back in a month . No appt    On schedule  Or since then .  Make appt with dr Clent RidgesFry  Can rx enough for 10 days  . Further refills  Per dr Clent RidgesFry.   Can disp # 60    Get tox screen assured toxicology  . When he picks up  rx  Last one in record was 2014 .

## 2016-02-22 NOTE — Telephone Encounter (Signed)
Spoke to the pt.  He had to leave and could not wait on prescription.  Stated he would like for his wife to pick up.  Will forward to Dr. Clent RidgesFry to see if he wants to fill for regular amount and see about tox screen.

## 2016-02-23 MED ORDER — OXYCODONE HCL 15 MG PO TABS
15.0000 mg | ORAL_TABLET | ORAL | Status: DC | PRN
Start: 2016-02-23 — End: 2016-03-27

## 2016-02-23 NOTE — Telephone Encounter (Signed)
done

## 2016-02-23 NOTE — Telephone Encounter (Signed)
Patient notified rx is ready for pick up - and notified to ask for Amy for Tox screen when picking up Rx.

## 2016-02-29 ENCOUNTER — Telehealth: Payer: Self-pay | Admitting: Family Medicine

## 2016-02-29 MED ORDER — AMPHETAMINE-DEXTROAMPHETAMINE 20 MG PO TABS: 20.0000 mg | ORAL_TABLET | Freq: Three times a day (TID) | ORAL | Status: DC

## 2016-02-29 NOTE — Telephone Encounter (Signed)
done

## 2016-02-29 NOTE — Telephone Encounter (Signed)
Pt request refill  amphetamine-dextroamphetamine (ADDERALL) 20 MG tablet 3 mo supply  Pt states he will try to get here tomorrow for the drug screen

## 2016-03-01 ENCOUNTER — Encounter: Payer: Self-pay | Admitting: Family Medicine

## 2016-03-01 NOTE — Telephone Encounter (Signed)
Script is ready for pick up and left a voice message for pt, also put on envelope about the UDS.

## 2016-03-21 ENCOUNTER — Telehealth: Payer: Self-pay | Admitting: Family Medicine

## 2016-03-21 NOTE — Telephone Encounter (Signed)
Pt needs new rx oxycodone °

## 2016-03-23 ENCOUNTER — Encounter: Payer: Self-pay | Admitting: Family Medicine

## 2016-03-27 MED ORDER — OXYCODONE HCL 15 MG PO TABS: 15.0000 mg | ORAL_TABLET | ORAL | Status: DC | PRN

## 2016-03-27 NOTE — Telephone Encounter (Signed)
Script is ready for pick up and I left a voice message.  

## 2016-03-27 NOTE — Telephone Encounter (Signed)
done

## 2016-03-30 ENCOUNTER — Telehealth: Payer: Self-pay | Admitting: Family Medicine

## 2016-03-30 NOTE — Telephone Encounter (Signed)
Pt need new Adderall and Cialis

## 2016-03-31 MED ORDER — TADALAFIL 20 MG PO TABS: 20.0000 mg | ORAL_TABLET | Freq: Every day | ORAL | Status: DC | PRN

## 2016-03-31 NOTE — Telephone Encounter (Signed)
He already has refills of Adderall until 05-31-16. Call in Cialis #10 with 11 rf

## 2016-03-31 NOTE — Telephone Encounter (Signed)
I sent script for Cialis e-scribe to [facility].

## 2016-03-31 NOTE — Telephone Encounter (Signed)
Pt forgot he got 3 rx for adderall

## 2016-04-24 ENCOUNTER — Telehealth: Payer: Self-pay | Admitting: Family Medicine

## 2016-04-24 NOTE — Telephone Encounter (Signed)
Refill oxyCODONE (ROXICODONE) 15 MG immediate release tablet   

## 2016-04-25 NOTE — Telephone Encounter (Signed)
Pt called to check the status of Rx I let pt know that it can take up to 3 business before it is ready.

## 2016-04-27 MED ORDER — OXYCODONE HCL 15 MG PO TABS: 15.0000 mg | ORAL_TABLET | ORAL | 0 refills | Status: DC | PRN

## 2016-04-27 NOTE — Telephone Encounter (Signed)
done

## 2016-04-27 NOTE — Telephone Encounter (Signed)
Script is ready for pick up here at front office and I spoke with pt.  

## 2016-05-22 ENCOUNTER — Telehealth: Payer: Self-pay | Admitting: Family Medicine

## 2016-05-22 NOTE — Telephone Encounter (Signed)
Pt need new Rx for oxycodone °

## 2016-05-22 NOTE — Telephone Encounter (Signed)
Last filled on 8/17 + 180, last OV 3/22. Ok to refill?

## 2016-05-23 MED ORDER — OXYCODONE HCL 15 MG PO TABS: 15.0000 mg | ORAL_TABLET | ORAL | 0 refills | Status: DC | PRN

## 2016-05-23 NOTE — Telephone Encounter (Signed)
Called patient to inform him his Rx is ready for pick up.

## 2016-05-23 NOTE — Telephone Encounter (Signed)
done

## 2016-05-31 ENCOUNTER — Other Ambulatory Visit: Payer: Self-pay | Admitting: Family Medicine

## 2016-05-31 NOTE — Telephone Encounter (Signed)
Pt request refill amphetamine-dextroamphetamine (ADDERALL) 20 MG tablet °3 mo supply °

## 2016-06-01 NOTE — Telephone Encounter (Signed)
Pt calling to check the status of his Rx.  Made pt aware of the three business for refill.

## 2016-06-02 MED ORDER — AMPHETAMINE-DEXTROAMPHETAMINE 20 MG PO TABS: 20.0000 mg | ORAL_TABLET | Freq: Three times a day (TID) | ORAL | 0 refills | Status: DC

## 2016-06-02 NOTE — Telephone Encounter (Signed)
Script is ready for pick up here at front office, tried to reach pt and no answer.  

## 2016-06-02 NOTE — Telephone Encounter (Signed)
done

## 2016-06-22 ENCOUNTER — Telehealth: Payer: Self-pay | Admitting: Family Medicine

## 2016-06-22 NOTE — Telephone Encounter (Signed)
Pt request refill  °oxyCODONE (ROXICODONE) 15 MG immediate release tablet °

## 2016-06-26 MED ORDER — OXYCODONE HCL 15 MG PO TABS: 15.0000 mg | ORAL_TABLET | ORAL | 0 refills | Status: DC | PRN

## 2016-06-26 NOTE — Telephone Encounter (Signed)
Pt has received his Rx (3).

## 2016-06-26 NOTE — Telephone Encounter (Signed)
done

## 2016-08-30 ENCOUNTER — Telehealth: Payer: Self-pay | Admitting: Family Medicine

## 2016-08-30 MED ORDER — AMPHETAMINE-DEXTROAMPHETAMINE 20 MG PO TABS: 20.0000 mg | ORAL_TABLET | Freq: Three times a day (TID) | ORAL | 0 refills | Status: DC

## 2016-08-30 NOTE — Telephone Encounter (Signed)
done

## 2016-08-30 NOTE — Telephone Encounter (Signed)
Scripts are ready for pick up here at front office and tried to reach pt, no answer.

## 2016-08-30 NOTE — Telephone Encounter (Signed)
Pt need new Rx for Adderall  Pt is aware of 3 business day for refills.

## 2016-09-26 ENCOUNTER — Telehealth: Payer: Self-pay | Admitting: Family Medicine

## 2016-09-26 MED ORDER — OXYCODONE HCL 15 MG PO TABS: 15.0000 mg | ORAL_TABLET | ORAL | 0 refills | Status: DC | PRN

## 2016-09-26 NOTE — Telephone Encounter (Signed)
Script is ready for pick up here at front office, tried to reach pt and no answer.  

## 2016-09-26 NOTE — Telephone Encounter (Signed)
Pt needs new rx oxycodone °

## 2016-09-26 NOTE — Telephone Encounter (Signed)
Done but we will need to start writing only one month supply at a time

## 2016-09-28 NOTE — Telephone Encounter (Signed)
Pt wife will pick up tonight

## 2016-10-02 ENCOUNTER — Telehealth: Payer: Self-pay | Admitting: Family Medicine

## 2016-10-02 MED ORDER — AMPHETAMINE-DEXTROAMPHETAMINE 20 MG PO TABS: 20.0000 mg | ORAL_TABLET | Freq: Three times a day (TID) | ORAL | 0 refills | Status: DC

## 2016-10-02 NOTE — Telephone Encounter (Signed)
Pt request refill of the following: amphetamine-dextroamphetamine (ADDERALL) 20 MG tablet ° ° °Phamacy: ° °

## 2016-10-02 NOTE — Telephone Encounter (Signed)
done

## 2016-10-03 NOTE — Telephone Encounter (Signed)
Script is ready for pick up here at front office, tried to reach pt and no answer.  

## 2016-10-26 ENCOUNTER — Telehealth: Payer: Self-pay | Admitting: Family Medicine

## 2016-10-26 NOTE — Telephone Encounter (Signed)
Pt needs new rx oxycodone °

## 2016-10-27 MED ORDER — OXYCODONE HCL 15 MG PO TABS: 15.0000 mg | ORAL_TABLET | ORAL | 0 refills | Status: DC | PRN

## 2016-10-27 NOTE — Telephone Encounter (Signed)
Script is ready for pick up here at front office and tried to reach pt no answer.

## 2016-10-27 NOTE — Telephone Encounter (Signed)
done

## 2016-11-21 ENCOUNTER — Telehealth: Payer: Self-pay | Admitting: Family Medicine

## 2016-11-21 MED ORDER — OXYCODONE HCL 15 MG PO TABS: 15.0000 mg | ORAL_TABLET | ORAL | 0 refills | Status: DC | PRN

## 2016-11-21 NOTE — Telephone Encounter (Signed)
° ° °  Pt request refill of the following: ° °oxyCODONE (ROXICODONE) 15 MG immediate release tablet ° ° °Phamacy: °

## 2016-11-21 NOTE — Telephone Encounter (Signed)
Done

## 2016-11-22 NOTE — Telephone Encounter (Signed)
Script is ready for pick up here at front office and tried to reach pt, no answer.  

## 2016-12-21 ENCOUNTER — Telehealth: Payer: Self-pay | Admitting: Family Medicine

## 2016-12-21 MED ORDER — OXYCODONE HCL 15 MG PO TABS: 15.0000 mg | ORAL_TABLET | ORAL | 0 refills | Status: DC | PRN

## 2016-12-21 NOTE — Telephone Encounter (Signed)
Done, but he will need an OV for any further refills

## 2016-12-21 NOTE — Telephone Encounter (Signed)
Script is ready for pick up here at front office, tried to reach pt no answer, made a note on script must schedule a office visit.

## 2016-12-21 NOTE — Telephone Encounter (Signed)
° ° °  Pt request refill of the following: ° °oxyCODONE (ROXICODONE) 15 MG immediate release tablet ° ° °Phamacy: °

## 2016-12-27 ENCOUNTER — Telehealth: Payer: Self-pay | Admitting: Family Medicine

## 2016-12-27 MED ORDER — AMPHETAMINE-DEXTROAMPHETAMINE 20 MG PO TABS: 20.0000 mg | ORAL_TABLET | Freq: Three times a day (TID) | ORAL | 0 refills | Status: DC

## 2016-12-27 NOTE — Telephone Encounter (Signed)
Pt request refill of the following: amphetamine-dextroamphetamine (ADDERALL) 20 MG tablet ° ° °Phamacy: ° °

## 2016-12-27 NOTE — Telephone Encounter (Signed)
done

## 2016-12-28 NOTE — Telephone Encounter (Signed)
Script is ready for pick up here at front office and I left a voice message for pt with this information.  

## 2017-01-03 ENCOUNTER — Telehealth: Payer: Self-pay | Admitting: Family Medicine

## 2017-01-03 NOTE — Telephone Encounter (Signed)
Pt is aware that he needs to schedule appointment soon with Dr. Clent Ridges. He has recently switched jobs and currently does not have insurance, will have in June and can make appointment then for follow up on medications. Dr. Clent Ridges is aware of this message.

## 2017-01-16 ENCOUNTER — Telehealth: Payer: Self-pay | Admitting: Family Medicine

## 2017-01-16 NOTE — Telephone Encounter (Signed)
° ° °  Pt request refill of the following: ° °oxyCODONE (ROXICODONE) 15 MG immediate release tablet ° ° °Phamacy: °

## 2017-01-17 MED ORDER — OXYCODONE HCL 15 MG PO TABS: 15.0000 mg | ORAL_TABLET | ORAL | 0 refills | Status: DC | PRN

## 2017-01-17 NOTE — Telephone Encounter (Signed)
Script is ready for pick up here at front office, tried to reach pt and no answer.  

## 2017-01-17 NOTE — Telephone Encounter (Signed)
done

## 2017-01-25 ENCOUNTER — Telehealth: Payer: Self-pay | Admitting: Family Medicine

## 2017-01-25 NOTE — Telephone Encounter (Signed)
Pt need new Rx for Adderall   Pt is aware of 3 business days for refills and someone will call when ready for pick up. °

## 2017-01-26 NOTE — Telephone Encounter (Signed)
I left a voice message for pt, he should still have 2 scripts left.

## 2017-02-13 ENCOUNTER — Encounter: Payer: Self-pay | Admitting: Family Medicine

## 2017-02-13 ENCOUNTER — Ambulatory Visit (INDEPENDENT_AMBULATORY_CARE_PROVIDER_SITE_OTHER): Payer: Self-pay | Admitting: Family Medicine

## 2017-02-13 VITALS — BP 132/78 | HR 86 | Temp 98.0°F | Ht 74.0 in | Wt 204.0 lb

## 2017-02-13 DIAGNOSIS — M5440 Lumbago with sciatica, unspecified side: Secondary | ICD-10-CM

## 2017-02-13 DIAGNOSIS — E782 Mixed hyperlipidemia: Secondary | ICD-10-CM

## 2017-02-13 DIAGNOSIS — F418 Other specified anxiety disorders: Secondary | ICD-10-CM

## 2017-02-13 DIAGNOSIS — F900 Attention-deficit hyperactivity disorder, predominantly inattentive type: Secondary | ICD-10-CM

## 2017-02-13 DIAGNOSIS — G8929 Other chronic pain: Secondary | ICD-10-CM

## 2017-02-13 MED ORDER — OXYCODONE HCL 15 MG PO TABS: 15.0000 mg | ORAL_TABLET | ORAL | 0 refills | Status: DC | PRN

## 2017-02-13 MED ORDER — LORAZEPAM 1 MG PO TABS: ORAL_TABLET | ORAL | 2 refills | Status: DC

## 2017-02-13 NOTE — Progress Notes (Signed)
   Subjective:    Patient ID: Arvella NighKevin M Krolikowski, male    DOB: 06/09/1966, 51 y.o.   MRN: [id number]  HPI Here to follow up. He has done well and has no concerns.    Review of Systems  Constitutional: Negative.   Respiratory: Negative.   Cardiovascular: Negative.   Musculoskeletal: Positive for back pain.  Neurological: Negative.   Psychiatric/Behavioral: Negative.        Objective:   Physical Exam  Constitutional: He is oriented to person, place, and time. He appears well-developed and well-nourished.  Cardiovascular: Normal rate, regular rhythm, normal heart sounds and intact distal pulses.   Pulmonary/Chest: Effort normal and breath sounds normal.  Neurological: He is alert and oriented to person, place, and time.  Psychiatric: He has a normal mood and affect. His behavior is normal. Thought content normal.          Assessment & Plan:  His ADHD is stable. His chronic back pain is stable. His depression and anxiety are stable. He will return soon for fasting labs.  Gershon CraneStephen Miquela Costabile, MD

## 2017-02-13 NOTE — Patient Instructions (Signed)
WE NOW OFFER    [facility] FAST TRACK!!!  SAME DAY Appointments for ACUTE CARE  Such as: Sprains, Injuries, cuts, abrasions, rashes, muscle pain, joint pain, back pain Colds, flu, sore throats, headache, allergies, cough, fever  Ear pain, sinus and eye infections Abdominal pain, nausea, vomiting, diarrhea, upset stomach Animal/insect bites  3 Easy Ways to Schedule: Walk-In Scheduling Call in scheduling Mychart Sign-up: [email or web address]         

## 2017-03-13 ENCOUNTER — Telehealth: Payer: Self-pay | Admitting: Family Medicine

## 2017-03-13 NOTE — Telephone Encounter (Signed)
Pt need new Rx for Adderall and oxycodone  Pt is aware of 3 business days for refills and someone will call when ready for pick up.

## 2017-03-15 MED ORDER — AMPHETAMINE-DEXTROAMPHETAMINE 20 MG PO TABS: 20.0000 mg | ORAL_TABLET | Freq: Three times a day (TID) | ORAL | 0 refills | Status: DC

## 2017-03-15 MED ORDER — OXYCODONE HCL 15 MG PO TABS: 15.0000 mg | ORAL_TABLET | ORAL | 0 refills | Status: DC | PRN

## 2017-03-15 NOTE — Telephone Encounter (Signed)
done

## 2017-03-15 NOTE — Telephone Encounter (Signed)
Script is ready for pick up here at front office and I spoke with Shane Reese.  

## 2017-04-16 ENCOUNTER — Telehealth: Payer: Self-pay | Admitting: Family Medicine

## 2017-04-16 NOTE — Telephone Encounter (Signed)
° ° °  Pt request refill of the following: ° °oxyCODONE (ROXICODONE) 15 MG immediate release tablet ° ° °Phamacy: °

## 2017-04-17 MED ORDER — OXYCODONE HCL 15 MG PO TABS: 15.0000 mg | ORAL_TABLET | ORAL | 0 refills | Status: DC | PRN

## 2017-04-17 NOTE — Telephone Encounter (Signed)
done

## 2017-04-18 NOTE — Telephone Encounter (Signed)
Script is ready for pick up here at front office, tried to reach pt and no answer.  

## 2017-05-16 ENCOUNTER — Telehealth: Payer: Self-pay | Admitting: Family Medicine

## 2017-05-16 MED ORDER — OXYCODONE HCL 15 MG PO TABS: 15.0000 mg | ORAL_TABLET | ORAL | 0 refills | Status: DC | PRN

## 2017-05-16 NOTE — Telephone Encounter (Signed)
done

## 2017-05-16 NOTE — Telephone Encounter (Signed)
Pt needs new rx oxycodone °

## 2017-05-17 NOTE — Telephone Encounter (Signed)
Script is ready for pick up here at front office and left a message.  

## 2017-06-13 ENCOUNTER — Telehealth: Payer: Self-pay | Admitting: Family Medicine

## 2017-06-13 MED ORDER — OXYCODONE HCL 15 MG PO TABS: 15.0000 mg | ORAL_TABLET | ORAL | 0 refills | Status: DC | PRN

## 2017-06-13 NOTE — Telephone Encounter (Signed)
Pt need new Rx for Oxycodone   Pt is aware of 3 business days for refills and someone will call when ready for pick up. °

## 2017-06-13 NOTE — Telephone Encounter (Signed)
Done

## 2017-06-14 NOTE — Telephone Encounter (Signed)
Script is ready for pick up here at front office and I left a voice message, also letter was given.  

## 2017-06-29 ENCOUNTER — Telehealth: Payer: Self-pay | Admitting: Family Medicine

## 2017-06-29 MED ORDER — AMPHETAMINE-DEXTROAMPHETAMINE 20 MG PO TABS: 20.0000 mg | ORAL_TABLET | Freq: Three times a day (TID) | ORAL | 0 refills | Status: DC

## 2017-06-29 NOTE — Telephone Encounter (Signed)
Pt request refill of the following: amphetamine-dextroamphetamine (ADDERALL) 20 MG tablet ° ° °Phamacy: ° °

## 2017-06-29 NOTE — Telephone Encounter (Signed)
Done

## 2017-06-29 NOTE — Telephone Encounter (Signed)
Script is ready for pick up here at front office and left a voice message for pt.  

## 2017-07-12 ENCOUNTER — Telehealth: Payer: Self-pay | Admitting: Family Medicine

## 2017-07-12 NOTE — Telephone Encounter (Signed)
Pt need new Rx for Oxycodone   Pt is aware of 3 business days for refills and someone will call when ready for pick up. °

## 2017-07-17 ENCOUNTER — Encounter: Payer: Self-pay | Admitting: Family Medicine

## 2017-07-17 ENCOUNTER — Ambulatory Visit (INDEPENDENT_AMBULATORY_CARE_PROVIDER_SITE_OTHER): Payer: Self-pay | Admitting: Family Medicine

## 2017-07-17 VITALS — BP 125/75 | HR 96 | Temp 98.0°F | Ht 74.0 in | Wt 200.0 lb

## 2017-07-17 DIAGNOSIS — F900 Attention-deficit hyperactivity disorder, predominantly inattentive type: Secondary | ICD-10-CM

## 2017-07-17 DIAGNOSIS — G8929 Other chronic pain: Secondary | ICD-10-CM

## 2017-07-17 DIAGNOSIS — M5440 Lumbago with sciatica, unspecified side: Secondary | ICD-10-CM

## 2017-07-17 MED ORDER — OXYCODONE HCL 15 MG PO TABS: 15.0000 mg | ORAL_TABLET | ORAL | 0 refills | Status: DC | PRN

## 2017-07-17 NOTE — Telephone Encounter (Signed)
We will address this at his OV today

## 2017-07-17 NOTE — Progress Notes (Signed)
   Subjective:    Patient ID: Shane Reese, male    DOB: 03/31/1966, 51 y.o.   MRN: [id number]  HPI Here to follow up. He feels well and his back pain is well controlled on his medications. His ADHD is stable. He has been seeing his dentist to slowly removed all his broken off teeth and he plans to have some implants put in.    Review of Systems  Constitutional: Negative.   Respiratory: Negative.   Cardiovascular: Negative.   Musculoskeletal: Positive for back pain.  Neurological: Negative.   Psychiatric/Behavioral: Positive for decreased concentration. Negative for dysphoric mood. The patient is not nervous/anxious.        Objective:   Physical Exam  Constitutional: He is oriented to person, place, and time. He appears well-developed and well-nourished.  Neck: No thyromegaly present.  Cardiovascular: Normal rate, regular rhythm, normal heart sounds and intact distal pulses.  Pulmonary/Chest: Effort normal and breath sounds normal. No respiratory distress. He has no wheezes. He has no rales.  Lymphadenopathy:    He has no cervical adenopathy.  Neurological: He is alert and oriented to person, place, and time.          Assessment & Plan:  His back pain and ADHD are stable. Meds were refilled.  Gershon CraneStephen Fry, MD

## 2017-07-17 NOTE — Patient Instructions (Signed)
WE NOW OFFER   [facility] FAST TRACK!!!  SAME DAY Appointments for ACUTE CARE  Such as: Sprains, Injuries, cuts, abrasions, rashes, muscle pain, joint pain, back pain Colds, flu, sore throats, headache, allergies, cough, fever  Ear pain, sinus and eye infections Abdominal pain, nausea, vomiting, diarrhea, upset stomach Animal/insect bites  3 Easy Ways to Schedule: Walk-In Scheduling Call in scheduling Mychart Sign-up: [email or web address]         

## 2017-08-06 ENCOUNTER — Encounter: Payer: Self-pay | Admitting: Family Medicine

## 2017-08-09 NOTE — Telephone Encounter (Signed)
We are being closely monitored by the state so I cannot write early refills. He will need to wait until  the next scheduled refill date

## 2017-08-15 ENCOUNTER — Telehealth: Payer: Self-pay | Admitting: Family Medicine

## 2017-08-15 NOTE — Telephone Encounter (Signed)
Copied from CRM [id number]. Topic: Quick Communication - Rx Refill/Question >> Aug 15, 2017  4:45 PM Clack, Princella PellegriniJessica D wrote: Has the patient contacted their pharmacy? No.   (Agent: If no, request that the patient contact the pharmacy for the refill.)   Preferred Pharmacy (with phone number or street name): [id number] [place] - [id number] [facility] [address] [phone] (Phone) [phone] (Fax)  Pt is requesting a med refill on his oxyCODONE (ROXICODONE) 15 MG immediate release tablet [[id number]][[id number]]   Agent: Please be advised that RX refills may take up to 3 business days. We ask that you follow-up with your pharmacy.

## 2017-08-15 NOTE — Telephone Encounter (Signed)
Oxycodone IR refill. Last OV 07/17/17. Last refill on 07/27/17.

## 2017-08-16 MED ORDER — OXYCODONE HCL 15 MG PO TABS: 15.0000 mg | ORAL_TABLET | ORAL | 0 refills | Status: DC | PRN

## 2017-08-16 NOTE — Telephone Encounter (Signed)
Sent to PCP for approval.  

## 2017-08-16 NOTE — Telephone Encounter (Signed)
Done but he will need a pmv soon  

## 2017-08-17 NOTE — Telephone Encounter (Signed)
Called pt left a VM that Rx is raedy for pick up and pt needs to schedule for a PM OV soon. Rx placed up front.

## 2017-09-19 ENCOUNTER — Encounter: Payer: Self-pay | Admitting: Family Medicine

## 2017-09-19 ENCOUNTER — Ambulatory Visit: Payer: PRIVATE HEALTH INSURANCE | Admitting: Family Medicine

## 2017-09-19 VITALS — BP 110/70 | HR 87 | Temp 98.2°F | Wt 200.4 lb

## 2017-09-19 DIAGNOSIS — F1190 Opioid use, unspecified, uncomplicated: Secondary | ICD-10-CM

## 2017-09-19 DIAGNOSIS — M5440 Lumbago with sciatica, unspecified side: Secondary | ICD-10-CM

## 2017-09-19 DIAGNOSIS — G8929 Other chronic pain: Secondary | ICD-10-CM

## 2017-09-19 MED ORDER — OXYCODONE HCL 15 MG PO TABS: 15.0000 mg | ORAL_TABLET | ORAL | 0 refills | Status: DC | PRN

## 2017-09-19 MED ORDER — AMPHETAMINE-DEXTROAMPHETAMINE 20 MG PO TABS: 20.0000 mg | ORAL_TABLET | Freq: Three times a day (TID) | ORAL | 0 refills | Status: DC

## 2017-09-19 NOTE — Progress Notes (Signed)
   Subjective:    Patient ID: Shane Reese, male    DOB: 07/20/1966, 52 y.o.   MRN: [id number]  HPI Here for pain management. He is doing well with the back pain and he stays active.  Indication for chronic opioid: low back pain  Medication and dose: Oxycodone 15 mg  # pills per month: 180 Last UDS date: 09-19-17 Pain contract signed (Y/N): 09-19-17 Date narcotic database last reviewed (include red flags): 09-19-17    Review of Systems  Constitutional: Negative.   Respiratory: Negative.   Cardiovascular: Negative.   Musculoskeletal: Positive for back pain.  Neurological: Negative.        Objective:   Physical Exam  Constitutional: He is oriented to person, place, and time. He appears well-developed and well-nourished.  Cardiovascular: Normal rate, regular rhythm, normal heart sounds and intact distal pulses.  Pulmonary/Chest: Effort normal and breath sounds normal. No respiratory distress. He has no wheezes. He has no rales.  Neurological: He is alert and oriented to person, place, and time.          Assessment & Plan:  Stable low back pain. Meds were refilled.  Gershon CraneStephen Quill Grinder, MD

## 2017-09-26 LAB — PAIN MGMT, PROFILE 8 W/CONF, U
6 Acetylmorphine: NEGATIVE ng/mL (ref <10)
ALCOHOL METABOLITES: NEGATIVE ng/mL (ref <500)
AMPHETAMINE: 18502 ng/mL — AB (ref <250)
Amphetamines: POSITIVE ng/mL — AB (ref <500)
BENZODIAZEPINES: NEGATIVE ng/mL (ref <100)
Buprenorphine, Urine: POSITIVE ng/mL — AB (ref <5)
Buprenorphine: 481.3 ng/mL — ABNORMAL HIGH (ref <2)
COCAINE METABOLITE: NEGATIVE ng/mL (ref <150)
CODEINE: NEGATIVE ng/mL (ref <50)
CREATININE: 294.6 mg/dL
Hydrocodone: NEGATIVE ng/mL (ref <50)
Hydromorphone: NEGATIVE ng/mL (ref <50)
MDMA: NEGATIVE ng/mL (ref <500)
Marijuana Metabolite: NEGATIVE ng/mL (ref <20)
Methamphetamine: NEGATIVE ng/mL (ref <250)
Morphine: NEGATIVE ng/mL (ref <50)
NORHYDROCODONE: NEGATIVE ng/mL (ref <50)
Norbuprenorphine: 1090.8 ng/mL — ABNORMAL HIGH (ref <2)
OPIATES: NEGATIVE ng/mL (ref <100)
OXIDANT: NEGATIVE ug/mL (ref <200)
OXYCODONE: NEGATIVE ng/mL (ref <100)
PH: 6.81 (ref 4.5–9.0)

## 2017-10-21 ENCOUNTER — Encounter: Payer: Self-pay | Admitting: Family Medicine

## 2017-10-22 ENCOUNTER — Telehealth: Payer: Self-pay | Admitting: Family Medicine

## 2017-10-22 ENCOUNTER — Encounter: Payer: Self-pay | Admitting: Family Medicine

## 2017-10-22 MED ORDER — OXYCODONE HCL 15 MG PO TABS: 15.0000 mg | ORAL_TABLET | ORAL | 0 refills | Status: DC | PRN

## 2017-10-22 NOTE — Telephone Encounter (Signed)
Copied from CRM [phone]. Topic: General - Other >> Oct 22, 2017 10:47 AM Percival SpanishKennedy, Cheryl W wrote:  Pt went to the pharmacy to pick up the following med oxyCODONE (ROXICODONE) 15 MG immediate release tablet   and the RX was for March 2019 and not for February 2019  Pharmacy told pt to contact office to have the RX corrected

## 2017-10-22 NOTE — Telephone Encounter (Signed)
Pt calling again to check on this. He is having to leave town soon for [place] emergency in [place].

## 2017-10-22 NOTE — Telephone Encounter (Signed)
Sent to PCP ?

## 2017-10-22 NOTE — Telephone Encounter (Signed)
Called and spoke with pt. Pt advised and voiced understanding. Rx was sent in today and schedule for pt to fill Rx on 2/12 but pt needs the Rx today due to going to [place].   Per Dr, Clent RidgesFry he gave me the OK to call the pharmacy and advise them to fill Rx 1 day early.  Called and spoke with pharmacist and they stated that they will get the Rx ready for the pt today. Pt advised and voiced understanding.

## 2017-10-22 NOTE — Telephone Encounter (Signed)
We seem to have skipped this month's refill, so I sent in one dated for tomorrow

## 2017-12-17 ENCOUNTER — Encounter: Payer: Self-pay | Admitting: Family Medicine

## 2017-12-18 NOTE — Telephone Encounter (Signed)
He is required by law to have a PMV every 3 months. I can refill Adderall without an OV but not the pain meds

## 2017-12-20 ENCOUNTER — Ambulatory Visit (INDEPENDENT_AMBULATORY_CARE_PROVIDER_SITE_OTHER): Payer: PRIVATE HEALTH INSURANCE | Admitting: Family Medicine

## 2017-12-20 ENCOUNTER — Encounter: Payer: Self-pay | Admitting: Family Medicine

## 2017-12-20 VITALS — BP 138/80 | HR 86 | Temp 98.5°F | Ht 74.0 in | Wt 210.2 lb

## 2017-12-20 DIAGNOSIS — M5440 Lumbago with sciatica, unspecified side: Secondary | ICD-10-CM | POA: Diagnosis not present

## 2017-12-20 DIAGNOSIS — F1190 Opioid use, unspecified, uncomplicated: Secondary | ICD-10-CM

## 2017-12-20 DIAGNOSIS — G8929 Other chronic pain: Secondary | ICD-10-CM | POA: Diagnosis not present

## 2017-12-20 MED ORDER — OXYCODONE HCL 15 MG PO TABS: 15.0000 mg | ORAL_TABLET | ORAL | 0 refills | Status: DC | PRN

## 2017-12-20 MED ORDER — AMPHETAMINE-DEXTROAMPHETAMINE 20 MG PO TABS: 20.0000 mg | ORAL_TABLET | Freq: Three times a day (TID) | ORAL | 0 refills | Status: DC

## 2017-12-20 NOTE — Progress Notes (Signed)
   Subjective:    Patient ID: Arvella NighKevin M Leonetti, male    DOB: 05/06/1966, 52 y.o.   MRN: [id number]  HPI Here for pain management. He has been doing well. Indication for chronic opioid: low back pain Medication and dose: Oxycodone 15 mg # pills per month: 180 Last UDS date: 09-19-17 Opioid Treatment Agreement signed (Y/N): 12-20-17 Opioid Treatment Agreement last reviewed with patient:  12-20-17 NCCSRS reviewed this encounter (include red flags):  12-20-17    Review of Systems  Constitutional: Negative.   Respiratory: Negative.   Cardiovascular: Negative.   Musculoskeletal: Positive for back pain.  Neurological: Negative.        Objective:   Physical Exam  Constitutional: He is oriented to person, place, and time. He appears well-developed and well-nourished.  Cardiovascular: Normal rate, regular rhythm, normal heart sounds and intact distal pulses.  Pulmonary/Chest: Effort normal and breath sounds normal. No respiratory distress. He has no wheezes. He has no rales.  Neurological: He is alert and oriented to person, place, and time.          Assessment & Plan:  Pain management. Meds were refilled.  Gershon CraneStephen Fry, MD

## 2018-03-18 ENCOUNTER — Encounter: Payer: Self-pay | Admitting: Family Medicine

## 2018-03-19 NOTE — Telephone Encounter (Signed)
Yes he could come in on August 1 for this

## 2018-03-21 NOTE — Telephone Encounter (Signed)
Pt called b/c the mychart would not let him send a message but he is needing the oxyCODONE (ROXICODONE) 15 MG immediate release tablet [[id number]][[id number]]  Filled, if possible today, contact pt if needed

## 2018-03-22 MED ORDER — OXYCODONE HCL 15 MG PO TABS: 15.0000 mg | ORAL_TABLET | ORAL | 0 refills | Status: DC | PRN

## 2018-03-22 NOTE — Telephone Encounter (Signed)
Done

## 2018-04-01 ENCOUNTER — Encounter: Payer: Self-pay | Admitting: Family Medicine

## 2018-04-04 ENCOUNTER — Telehealth: Payer: Self-pay | Admitting: Family Medicine

## 2018-04-04 ENCOUNTER — Encounter: Payer: Self-pay | Admitting: Family Medicine

## 2018-04-04 NOTE — Telephone Encounter (Signed)
Copied from CRM [id number]. Topic: Quick Communication - Rx Refill/Question >> Apr 04, 2018  2:17 PM Oneal GroutSebastian, Jennifer S wrote: Medication: amphetamine-dextroamphetamine (ADDERALL) 20 MG tablet   Has the patient contacted their pharmacy? Yes.   (Agent: If no, request that the patient contact the pharmacy for the refill.) (Agent: If yes, when and what did the pharmacy advise?)  Preferred Pharm[facility] (with phone number or street name[facility] [address]  Agent: Please be advised that RX refills may take up to 3 business days. We ask that you follow-up with your pharmacy.

## 2018-04-05 ENCOUNTER — Encounter: Payer: Self-pay | Admitting: Family Medicine

## 2018-04-05 MED ORDER — AMPHETAMINE-DEXTROAMPHETAMINE 20 MG PO TABS: 20.0000 mg | ORAL_TABLET | Freq: Three times a day (TID) | ORAL | 0 refills | Status: DC

## 2018-04-05 NOTE — Addendum Note (Signed)
Addended by: Gershon CraneFRY, STEPHEN A on: 04/05/2018 05:38 PM   Modules accepted: Orders

## 2018-04-05 NOTE — Telephone Encounter (Signed)
Done

## 2018-04-05 NOTE — Telephone Encounter (Signed)
Called pt's pharmacy pt does NOT have any refills.  Sent to PCP to advise

## 2018-04-05 NOTE — Telephone Encounter (Signed)
aDDERALL refill Last Refill:6/22/199 # 90 Last OV: 4//11/19 [facility]

## 2018-04-08 NOTE — Telephone Encounter (Signed)
These were sent last Friday

## 2018-04-12 ENCOUNTER — Ambulatory Visit (INDEPENDENT_AMBULATORY_CARE_PROVIDER_SITE_OTHER): Payer: Self-pay | Admitting: Family Medicine

## 2018-04-12 ENCOUNTER — Encounter: Payer: Self-pay | Admitting: Family Medicine

## 2018-04-12 VITALS — BP 118/80 | HR 130 | Temp 98.6°F | Ht 74.0 in | Wt 207.2 lb

## 2018-04-12 DIAGNOSIS — M5440 Lumbago with sciatica, unspecified side: Secondary | ICD-10-CM

## 2018-04-12 DIAGNOSIS — R079 Chest pain, unspecified: Secondary | ICD-10-CM

## 2018-04-12 DIAGNOSIS — G8929 Other chronic pain: Secondary | ICD-10-CM

## 2018-04-12 DIAGNOSIS — F1190 Opioid use, unspecified, uncomplicated: Secondary | ICD-10-CM

## 2018-04-12 MED ORDER — OXYCODONE HCL 15 MG PO TABS: 15.0000 mg | ORAL_TABLET | ORAL | 0 refills | Status: DC | PRN

## 2018-04-12 NOTE — Progress Notes (Signed)
   Subjective:    Patient ID: Shane NighKevin M Ahn, male    DOB: 01/21/1966, 52 y.o.   MRN: [id number]  HPI Here for pain management. He is doing well from this perspective. However he also mentions several episodes in the past few weeks of feeling his heart race and feeling a slight pressure on the chest. His BP has been high on a few occasion, the highest being 170/110 at his dentist's office. Most of the time at home it runs in the 130s over 80s (his wife, who is a NP, checks this every day). Today he feels back to normal. He admits he has been under a little more stress lately than usual. No SOB or headaches or dizziness.  Indication for chronic opioid: low back pain  Medication and dose: Oxycodone 15 mg  # pills per month: 180 Last UDS date: 09-19-17 Opioid Treatment Agreement signed (Y/N): 12-20-17 Opioid Treatment Agreement last reviewed with patient:  04-12-18 NCCSRS reviewed this encounter (include red flags):  04-12-18    Review of Systems  Constitutional: Negative.   Respiratory: Negative.   Cardiovascular: Positive for chest pain. Negative for palpitations and leg swelling.  Musculoskeletal: Positive for back pain.  Neurological: Negative.        Objective:   Physical Exam  Constitutional: He is oriented to person, place, and time. He appears well-developed and well-nourished.  Cardiovascular: Normal rate, regular rhythm, normal heart sounds and intact distal pulses.  EKG is normal   Pulmonary/Chest: Effort normal and breath sounds normal.  Neurological: He is alert and oriented to person, place, and time.          Assessment & Plan:  Pain management, meds were refilled. We agreed that his recent chest symptoms and elevated BP are the result of stress. He will follow these closely and return if the BP remains high.  Gershon CraneStephen Solomiya Pascale, MD

## 2018-04-29 ENCOUNTER — Telehealth: Payer: Self-pay | Admitting: *Deleted

## 2018-04-29 NOTE — Telephone Encounter (Signed)
Prior auth for Oxycodone 15mg  immediate release tabs sent to [email or web address]-key [id number].

## 2018-04-30 ENCOUNTER — Telehealth: Payer: Self-pay | Admitting: Family Medicine

## 2018-04-30 NOTE — Telephone Encounter (Signed)
Copied from CRM #[id number]. Topic: General - Other >> Apr 30, 2018 10:04 AM Renardo Cheatum L, NT wrote: Reason for CRM: Director Dr Gonzales called from [employer] in regards to a prior authorization for oxycodone (ROXICODONE) 15 MG immediate release tablet.  The ref # is [id number] and her cb # is  [phone] please call her as soon as possible ,she has other questions ,there is information missing on the paper work faxed over .please advise 

## 2018-04-30 NOTE — Telephone Encounter (Signed)
Copied from CRM [id number]. Topic: General - Other >> Apr 30, 2018 10:04 AM Stephannie LiSimmons, Flynn Lininger L, NT wrote: Reason for CRM: Director Dr Marcos [facility]onzales [facility] to a prior authorization for oxycodone (ROXICODONE) 15 MG immediate release tablet.  The ref # is [id number] and her cb # is  [phone] please call her as soon as possible ,she has other questions ,there is information missing on the paper work faxed over .please advise

## 2018-04-30 NOTE — Telephone Encounter (Signed)
I left a message for Dr Jayme CloudGonzalez to return my call with the questions she has.  CRM also created.

## 2018-04-30 NOTE — Telephone Encounter (Signed)
Dr Jayme CloudGonzalez called back and asked the following questions: 1) does the patient need to take the Rx around the clock? 2) has the patient failed at least 1 week of immediate release opoids? 3) is there a pain agreement in place? 4) has the patient been assessed on a quarterly basis? 5) is the physician educating the pt about opoids or prescribing Narcan? 6) has the patient seen a pain management specialist in consult or been referred?   Message sent to Dr Claris CheFry's asst to call Dr Jayme CloudGonzalez with answers.

## 2018-04-30 NOTE — Telephone Encounter (Signed)
Sent to PCP to advise will call to verify question once answered by PCP

## 2018-04-30 NOTE — Telephone Encounter (Signed)
Fax received from [employer] stating the request was denied and this was given to Dr Fry's asst. 

## 2018-04-30 NOTE — Telephone Encounter (Signed)
[facility] stating the request was denied and this was given to Dr Claris CheFry's asst.

## 2018-04-30 NOTE — Telephone Encounter (Signed)
Placed in Dr. Fry's GREEN folder to advise  

## 2018-05-01 NOTE — Telephone Encounter (Signed)
To answer your questions, 1) yes, 2) yes, 3) yes, 4) yes, 5) education yes, Narcan no but I can prescribe the Narcan if needed, 6)yes he used to see a specialist but changed to me years ago

## 2018-05-02 ENCOUNTER — Encounter: Payer: Self-pay | Admitting: Family Medicine

## 2018-05-02 NOTE — Telephone Encounter (Signed)
I called Dr Jayme CloudGonzalez and relayed the answers below.  She stated to expect a fax from [employer] with a decision.  Message sent to Dr Claris CheFry's asst.

## 2018-05-03 NOTE — Telephone Encounter (Signed)
Sent to PCP as an FYI  

## 2018-05-10 NOTE — Telephone Encounter (Signed)
[facility] has [employer] for insurance now.  Insurance has been added in the system and wife's [employer] card has been scanned in.  They have the same number.

## 2018-05-15 ENCOUNTER — Other Ambulatory Visit: Payer: Self-pay

## 2018-05-15 ENCOUNTER — Emergency Department (HOSPITAL_COMMUNITY): Payer: Managed Care, Other (non HMO)

## 2018-05-15 ENCOUNTER — Emergency Department (HOSPITAL_COMMUNITY)
Admission: EM | Admit: 2018-05-15 | Discharge: 2018-05-16 | Disposition: A | Discharge status: Home / Self Care | Payer: Managed Care, Other (non HMO) | Attending: Emergency Medicine | Admitting: Emergency Medicine

## 2018-05-15 ENCOUNTER — Encounter (HOSPITAL_COMMUNITY): Payer: Self-pay | Admitting: Emergency Medicine

## 2018-05-15 DIAGNOSIS — F419 Anxiety disorder, unspecified: Secondary | ICD-10-CM | POA: Diagnosis not present

## 2018-05-15 DIAGNOSIS — K224 Dyskinesia of esophagus: Secondary | ICD-10-CM

## 2018-05-15 DIAGNOSIS — F909 Attention-deficit hyperactivity disorder, unspecified type: Secondary | ICD-10-CM | POA: Diagnosis not present

## 2018-05-15 DIAGNOSIS — F1020 Alcohol dependence, uncomplicated: Secondary | ICD-10-CM | POA: Insufficient documentation

## 2018-05-15 DIAGNOSIS — R079 Chest pain, unspecified: Secondary | ICD-10-CM | POA: Diagnosis present

## 2018-05-15 DIAGNOSIS — Z79899 Other long term (current) drug therapy: Secondary | ICD-10-CM | POA: Insufficient documentation

## 2018-05-15 DIAGNOSIS — F329 Major depressive disorder, single episode, unspecified: Secondary | ICD-10-CM | POA: Diagnosis not present

## 2018-05-15 LAB — CBC
HEMATOCRIT: 46.3 % (ref 39.0–52.0)
Hemoglobin: 15.2 g/dL (ref 13.0–17.0)
MCH: 30.3 pg (ref 26.0–34.0)
MCHC: 32.8 g/dL (ref 30.0–36.0)
MCV: 92.2 fL (ref 78.0–100.0)
PLATELETS: 263 10*3/uL (ref 150–400)
RBC: 5.02 MIL/uL (ref 4.22–5.81)
RDW: 13.4 % (ref 11.5–15.5)
WBC: 11.9 10*3/uL — AB (ref 4.0–10.5)

## 2018-05-15 LAB — BASIC METABOLIC PANEL
ANION GAP: 8 (ref 5–15)
BUN: 13 mg/dL (ref 6–20)
CO2: 30 mmol/L (ref 22–32)
CREATININE: 1.03 mg/dL (ref 0.61–1.24)
Calcium: 9.4 mg/dL (ref 8.9–10.3)
Chloride: 106 mmol/L (ref 98–111)
GFR calc non Af Amer: >60 mL/min (ref >60)
Glucose, Bld: 108 mg/dL — ABNORMAL HIGH (ref 70–99)
POTASSIUM: 4.1 mmol/L (ref 3.5–5.1)
Sodium: 144 mmol/L (ref 135–145)

## 2018-05-15 LAB — I-STAT TROPONIN, ED: Troponin i, poc: 0.03 ng/mL (ref 0.00–0.08)

## 2018-05-15 MED ORDER — GI COCKTAIL ~~LOC~~
30.0000 mL | Freq: Once | ORAL | Status: AC
  Administered 2018-05-15: 30 mL via ORAL
  Filled 2018-05-15: qty 30

## 2018-05-15 NOTE — ED Notes (Addendum)
Pt reports having a gas bubble that is sitting in the middle of his chest. Pt reports having this before. Pt reports this started after toasting a glass of champagne. Pt reports the pain sits right in the middle of his chest. Pt reports the pain is better now then it was up in triage.

## 2018-05-15 NOTE — ED Triage Notes (Addendum)
Pt had sudden onset about an hour ago of mid chest pressure. Endorses high BP lately.  Pt has been nauseated and inducing vomiting by sticking his finger down his throat.  No SHOB or diaphoresis. Was drinking champagne and believes it to be a gas bubble he cannot get up.

## 2018-05-15 NOTE — ED Notes (Signed)
Pt vomited up the GI cocktail.

## 2018-05-15 NOTE — ED Provider Notes (Signed)
[facility] EMERGENCY DEPARTMENT Provider Note   CSN: [id number] Arrival date & time: 05/15/18  2202     History   Chief Complaint Chief Complaint  Patient presents with  . Chest Pain    HPI KAREL TURPEN is a 52 y.o. male.  The history is provided by the patient and medical records.  Chest Pain       52 y.o. M with hx of ADHD, alcohol dependence, depression, presenting to the ED for chest pain.  States this started about 1.5 hours ago after toasting champagne at a celebration.  States "I knew better".  Patient generally has issues with carbonated beverages causing a lot of gas pains and worsening his acid reflux.  States usually this is resolved with simethicone, he took 3 tablets of this prior to arrival.  States it has started to calm down a little bit but he still feels like there is a small gas bubble in there.  States lately symptoms have been worse because he had some dental work done and has had problems eating solid foods.  States now whenever he eats something his stomach starts feeling very gassy but it is it is mostly been empty.  Wife also reports he has had a little bit elevated blood pressures recently and some stress.  He has no known cardiac history.  States he also tried to induce vomiting which helped a little as well.  Past Medical History:  Diagnosis Date  . ADHD (attention deficit hyperactivity disorder)   . Alcohol dependence ([facility])    sees [employer].  Abused cocaine and narcotics in the past   . Depression   . Drug dependence   . ED (erectile dysfunction)     Patient Active Problem List   Diagnosis Date Noted  . Chronic low back pain 11/10/2015  . Chronic gingivitis 01/06/2014  . Adenopathy, cervical 01/06/2014  . Anterior cervical adenopathy 12/22/2013  . ACUTE BRONCHITIS 12/15/2009  . DENTAL PAIN 12/15/2009  . ERECTILE DYSFUNCTION, ORGANIC 06/25/2009  . Attention deficit hyperactivity disorder (ADHD) 07/15/2007    . Depression with anxiety 06/10/2007    Past Surgical History:  Procedure Laterality Date  . l lower leg fx surgery          Home Medications    Prior to Admission medications   Medication Sig Start Date End Date Taking? Authorizing Provider  amphetamine-dextroamphetamine (ADDERALL) 20 MG tablet Take 1 tablet (20 mg total) by mouth 3 (three) times daily. 06/06/18 07/06/18  Nelwyn Salisbury, MD  LORazepam (ATIVAN) 1 MG tablet TAKE ONE TABLET EVERY 6 HOURS AS NEEDED. 02/13/17   Nelwyn Salisbury, MD  oxyCODONE (ROXICODONE) 15 MG immediate release tablet Take 1 tablet (15 mg total) by mouth every 4 (four) hours as needed for pain. 06/21/18 07/21/18  Nelwyn Salisbury, MD  tadalafil (CIALIS) 20 MG tablet Take 1 tablet (20 mg total) by mouth daily as needed. 03/31/16   Nelwyn Salisbury, MD    Family History No family history on file.  Social History Social History   Tobacco Use  . Smoking status: Never Smoker  . Smokeless tobacco: Never Used  Substance Use Topics  . Alcohol use: Yes    Alcohol/week: 0.0 standard drinks    Comment: occ  . Drug use: No     Allergies   Patient has no known allergies.   Review of Systems Review of Systems  Cardiovascular: Positive for chest pain.  All other systems reviewed and  are negative.    Physical Exam Updated Vital Signs BP (!) 143/82   Pulse 87   Resp 16   SpO2 99%   Physical Exam  Constitutional: He is oriented to person, place, and time. He appears well-developed and well-nourished.  HENT:  Head: Normocephalic and atraumatic.  Mouth/Throat: Oropharynx is clear and moist.  Eyes: Pupils are equal, round, and reactive to light. Conjunctivae and EOM are normal.  Neck: Normal range of motion.  Cardiovascular: Normal rate, regular rhythm and normal heart sounds.  Pulmonary/Chest: Effort normal and breath sounds normal. He has no decreased breath sounds. He has no wheezes. He has no rales.  Abdominal: Soft. Bowel sounds are normal.   Musculoskeletal: Normal range of motion.  Neurological: He is alert and oriented to person, place, and time.  Skin: Skin is warm and dry.  Psychiatric: He has a normal mood and affect.  Nursing note and vitals reviewed.    ED Treatments / Results  Labs (all labs ordered are listed, but only abnormal results are displayed) Labs Reviewed  BASIC METABOLIC PANEL - Abnormal; Notable for the following components:      Result Value   Glucose, Bld 108 (*)    All other components within normal limits  CBC - Abnormal; Notable for the following components:   WBC 11.9 (*)    All other components within normal limits  I-STAT TROPONIN, ED    EKG EKG Interpretation  Date/Time:  Wednesday May 15 2018 22:07:52 EDT Ventricular Rate:  79 PR Interval:  134 QRS Duration: 94 QT Interval:  366 QTC Calculation: 419 R Axis:   75 Text Interpretation:  Sinus rhythm with marked sinus arrhythmia with occasional Premature ventricular complexes Moderate voltage criteria for LVH, may be normal variant Borderline ECG No old tracing to compare Confirmed by Melene Plan [phone]) on 05/15/2018 10:29:03 PM   Radiology Dg Chest 2 View  Result Date: 05/15/2018 CLINICAL DATA:  Mid chest pain.  Hypertension. EXAM: CHEST - 2 VIEW COMPARISON:  None. FINDINGS: The heart size and mediastinal contours are within normal limits. Lungs are mildly hyperinflated without pulmonary consolidation. No pulmonary edema, effusion or pneumothorax. The visualized skeletal structures are unremarkable. IMPRESSION: No active cardiopulmonary disease.  Mild pulmonary hyperinflation. Electronically Signed   By: Tollie Eth M.D.   On: 05/15/2018 22:29    Procedures Procedures (including critical care time)  Medications Ordered in ED Medications  gi cocktail (Maalox,Lidocaine,Donnatal) (30 mLs Oral Given 05/15/18 2310)  ondansetron (ZOFRAN) injection 4 mg (4 mg Intravenous Given 05/16/18 0106)  glucagon (human recombinant) (GLUCAGEN)  injection 1 mg (1 mg Intravenous Given 05/16/18 0107)  famotidine (PEPCID) IVPB 20 mg premix (0 mg Intravenous Stopped 05/16/18 0138)  sodium chloride 0.9 % bolus 1,000 mL (0 mLs Intravenous Stopped 05/16/18 0226)     Initial Impression / Assessment and Plan / ED Course  I have reviewed the triage vital signs and the nursing notes.  Pertinent labs & imaging results that were available during my care of the patient were reviewed by me and considered in my medical decision making (see chart for details).  52 y.o. M here with central chest pain which he describes as a "gas" in his chest.  Has had some dental work recently and has not really been able to eat solid food, states he drank some champagne this evening which seemed to set things off.  Tried taking some over-the-counter Zantac without much relief.  He is afebrile and nontoxic.  Lungs are clear without  any wheezes or rhonchi.  Vitals are stable.  Abdomen soft and benign.  He did take Gas-X prior to arrival as well which she reports usually relieves his symptoms.  We will try GI cocktail.  EKG and chest x-ray reassuring, labs pending.  12:13 AM Patient vomited after GI cocktail.  States he feels like bubble is moving upwards into his chest but states character of pain is the same.  He has tried moving around without relief.  Wife is requesting EGD.  I do not feel this is indicated emergently at this time as it does not appear he has a food impaction as per reports he has not been eating solid foods.  Will place IV and give additional trial of medications.  Labs reviewed, reassuring.   2:10 AM Patient feeling somewhat better after additional medications.  States moving around seems to help.  He has been able to belch which helps pain as well.  Dr. Patria Mane went to evaluate patient, able to drink gingerale here without issue.  Patient and wife more comfortable.    Suspect esophageal spasm and possibly some GERD.  Doubt ACS, PE, dissection, or other acute  cardiac event.  Will have them follow-up with outpatient GI.  They will return here for any new/acute changes.  Final Clinical Impressions(s) / ED Diagnoses   Final diagnoses:  Esophageal spasm    ED Discharge Orders    None       Garlon Hatchet, PA-C 05/16/18 [id number]    Azalia Bilis, MD 05/16/18 [phone]

## 2018-05-16 MED ORDER — FAMOTIDINE IN NACL 20-0.9 MG/50ML-% IV SOLN
20.0000 mg | INTRAVENOUS | Status: AC
  Administered 2018-05-16: 20 mg via INTRAVENOUS
  Filled 2018-05-16: qty 50

## 2018-05-16 MED ORDER — SODIUM CHLORIDE 0.9 % IV BOLUS
1000.0000 mL | Freq: Once | INTRAVENOUS | Status: AC
  Administered 2018-05-16: 1000 mL via INTRAVENOUS

## 2018-05-16 MED ORDER — ONDANSETRON HCL 4 MG/2ML IJ SOLN
4.0000 mg | Freq: Once | INTRAMUSCULAR | Status: AC
  Administered 2018-05-16: 4 mg via INTRAVENOUS
  Filled 2018-05-16: qty 2

## 2018-05-16 MED ORDER — GLUCAGON HCL RDNA (DIAGNOSTIC) 1 MG IJ SOLR
1.0000 mg | Freq: Once | INTRAMUSCULAR | Status: AC
  Administered 2018-05-16: 1 mg via INTRAVENOUS
  Filled 2018-05-16: qty 1

## 2018-05-16 NOTE — Discharge Instructions (Signed)
Follow-up with GI. Return here for any new/acute changes.

## 2018-05-23 MED ORDER — OMEPRAZOLE 40 MG PO CPDR: 40.0000 mg | DELAYED_RELEASE_CAPSULE | Freq: Every day | ORAL | 5 refills | Status: DC

## 2018-05-23 NOTE — Telephone Encounter (Signed)
Done

## 2018-07-01 ENCOUNTER — Encounter: Payer: Self-pay | Admitting: Family Medicine

## 2018-07-01 MED ORDER — AMPHETAMINE-DEXTROAMPHETAMINE 20 MG PO TABS: 20.0000 mg | ORAL_TABLET | Freq: Three times a day (TID) | ORAL | 0 refills | Status: DC

## 2018-07-01 NOTE — Telephone Encounter (Signed)
Dr. Fry please advise. Thanks  

## 2018-07-01 NOTE — Telephone Encounter (Signed)
These were sent in to begin on Wed 07-03-18

## 2018-07-17 ENCOUNTER — Encounter: Payer: Self-pay | Admitting: Family Medicine

## 2018-07-19 NOTE — Telephone Encounter (Signed)
Dr. Fry please advise on refill. Thanks  

## 2018-07-21 ENCOUNTER — Encounter: Payer: Self-pay | Admitting: Family Medicine

## 2018-07-22 NOTE — Telephone Encounter (Signed)
Dr. Clent Ridges please advise on refill of the oxy for this pt.  Thanks

## 2018-07-23 MED ORDER — OXYCODONE HCL 15 MG PO TABS: 15.0000 mg | ORAL_TABLET | ORAL | 0 refills | Status: DC | PRN

## 2018-07-23 NOTE — Telephone Encounter (Signed)
I sent in for one month  

## 2018-07-23 NOTE — Telephone Encounter (Signed)
I did this today

## 2018-08-21 ENCOUNTER — Ambulatory Visit (INDEPENDENT_AMBULATORY_CARE_PROVIDER_SITE_OTHER): Payer: BLUE CROSS/BLUE SHIELD | Admitting: Family Medicine

## 2018-08-21 ENCOUNTER — Encounter: Payer: Self-pay | Admitting: Family Medicine

## 2018-08-21 VITALS — BP 138/88 | HR 95 | Temp 97.9°F | Wt 207.1 lb

## 2018-08-21 DIAGNOSIS — F1190 Opioid use, unspecified, uncomplicated: Secondary | ICD-10-CM

## 2018-08-21 DIAGNOSIS — M5440 Lumbago with sciatica, unspecified side: Secondary | ICD-10-CM

## 2018-08-21 DIAGNOSIS — G8929 Other chronic pain: Secondary | ICD-10-CM

## 2018-08-21 MED ORDER — OXYCODONE HCL 15 MG PO TABS: 15.0000 mg | ORAL_TABLET | ORAL | 0 refills | Status: DC | PRN

## 2018-08-21 MED ORDER — OMEPRAZOLE 40 MG PO CPDR: 40.0000 mg | DELAYED_RELEASE_CAPSULE | Freq: Every day | ORAL | 3 refills | Status: DC

## 2018-08-21 MED ORDER — TADALAFIL 20 MG PO TABS: 20.0000 mg | ORAL_TABLET | Freq: Every day | ORAL | 3 refills | Status: DC | PRN

## 2018-08-21 NOTE — Progress Notes (Signed)
   Subjective:    Patient ID: Arvella NighKevin M Pattison, male    DOB: 06/13/1966, 52 y.o.   MRN: [id number]  HPI Here for pain management. He is doing well.  Indication for chronic opioid: low back pain Medication and dose: Oxycodone 15 mg  # pills per month: 180 Last UDS date: 09-19-17 Opioid Treatment Agreement signed (Y/N): 12-20-17 Opioid Treatment Agreement last reviewed with patient:  08-21-18 NCCSRS reviewed this encounter (include red flags):  08-21-18    Review of Systems  Constitutional: Negative.   Respiratory: Negative.   Cardiovascular: Negative.   Musculoskeletal: Positive for back pain.  Neurological: Negative.        Objective:   Physical Exam  Constitutional: He is oriented to person, place, and time. He appears well-developed and well-nourished.  Cardiovascular: Normal rate, regular rhythm, normal heart sounds and intact distal pulses.  Pulmonary/Chest: Effort normal and breath sounds normal.  Neurological: He is alert and oriented to person, place, and time.          Assessment & Plan:  Pain management, meds were refilled.  Gershon CraneStephen Fry, MD

## 2018-09-02 ENCOUNTER — Other Ambulatory Visit: Payer: Self-pay | Admitting: Family Medicine

## 2018-09-02 NOTE — Telephone Encounter (Unsigned)
Copied from CRM [id number]. Topic: Quick Communication - Rx Refill/Question >> Sep 02, 2018  3:36 PM Mcneil, Ja-Kwan wrote: Medication: amphetamine-dextroamphetamine (ADDERALL) 20 MG tablet  Has the patient contacted their pharmacy? No  Preferred Pharmacy (with phone number or street name): Pt requests printed prescriptions so he can be sure that he is able to get the medication refilled  Agent: Please be advised that RX refills may take up to 3 business days. We ask that you follow-up with your pharmacy.

## 2018-09-02 NOTE — Telephone Encounter (Signed)
Pt wanting the prescription printed

## 2018-09-05 ENCOUNTER — Encounter: Payer: Self-pay | Admitting: Family Medicine

## 2018-09-05 NOTE — Telephone Encounter (Signed)
He is not due until Jan 22

## 2018-09-06 MED ORDER — AMPHETAMINE-DEXTROAMPHETAMINE 20 MG PO TABS: 20.0000 mg | ORAL_TABLET | Freq: Three times a day (TID) | ORAL | 0 refills | Status: DC

## 2018-09-06 NOTE — Telephone Encounter (Signed)
This obviously is not working, so I printed out the rx

## 2018-09-06 NOTE — Telephone Encounter (Signed)
Dr. Clent RidgesFry please advise if the adderal can be sent to the pharmacy.

## 2018-09-06 NOTE — Addendum Note (Signed)
Addended by: Gershon CraneFRY, STEPHEN A on: 09/06/2018 04:47 PM   Modules accepted: Orders

## 2018-09-09 ENCOUNTER — Encounter: Payer: Self-pay | Admitting: Family Medicine

## 2018-10-03 ENCOUNTER — Encounter: Payer: Self-pay | Admitting: Family Medicine

## 2018-10-04 NOTE — Telephone Encounter (Signed)
Dr. Clent Ridges please advise of refills please.

## 2018-10-07 MED ORDER — AMPHETAMINE-DEXTROAMPHETAMINE 20 MG PO TABS: 20.0000 mg | ORAL_TABLET | Freq: Three times a day (TID) | ORAL | 0 refills | Status: DC

## 2018-10-07 NOTE — Telephone Encounter (Signed)
Done

## 2018-10-16 ENCOUNTER — Encounter: Payer: Self-pay | Admitting: Family Medicine

## 2018-10-17 NOTE — Telephone Encounter (Signed)
As he requested, call in Cialis 20 mg #30 to the new pharmacy with 5 rf

## 2018-10-17 NOTE — Telephone Encounter (Signed)
Dr. Clent Ridges please advise on refill of the cialis.

## 2018-10-18 MED ORDER — TADALAFIL 20 MG PO TABS: 20.0000 mg | ORAL_TABLET | Freq: Every day | ORAL | 5 refills | Status: DC | PRN

## 2018-11-18 ENCOUNTER — Encounter: Payer: Self-pay | Admitting: Family Medicine

## 2018-11-19 NOTE — Telephone Encounter (Signed)
Dr. Fry please advise. Thanks  

## 2018-11-20 MED ORDER — OXYCODONE HCL 15 MG PO TABS: 15.0000 mg | ORAL_TABLET | ORAL | 0 refills | Status: DC | PRN

## 2018-11-20 NOTE — Telephone Encounter (Signed)
I sent in a one month supply  

## 2018-11-22 ENCOUNTER — Telehealth: Payer: Self-pay

## 2018-11-22 NOTE — Telephone Encounter (Signed)
Lvm for pt to call back to try to reschedule pts appt on 11/25/2018 to a morning time since we are seeing well pts in the morning and sick pts in the afternoon up to pt if they want to take the risk . Okay for nurse triage to disclose. Crm created

## 2018-11-25 ENCOUNTER — Encounter: Payer: Self-pay | Admitting: Family Medicine

## 2018-11-25 ENCOUNTER — Ambulatory Visit (INDEPENDENT_AMBULATORY_CARE_PROVIDER_SITE_OTHER): Payer: BLUE CROSS/BLUE SHIELD | Admitting: Family Medicine

## 2018-11-25 ENCOUNTER — Ambulatory Visit: Payer: BLUE CROSS/BLUE SHIELD | Admitting: Family Medicine

## 2018-11-25 ENCOUNTER — Other Ambulatory Visit: Payer: Self-pay

## 2018-11-25 VITALS — BP 124/80 | HR 68 | Temp 97.8°F | Wt 214.0 lb

## 2018-11-25 DIAGNOSIS — F1190 Opioid use, unspecified, uncomplicated: Secondary | ICD-10-CM | POA: Diagnosis not present

## 2018-11-25 DIAGNOSIS — G8929 Other chronic pain: Secondary | ICD-10-CM

## 2018-11-25 DIAGNOSIS — M5440 Lumbago with sciatica, unspecified side: Secondary | ICD-10-CM

## 2018-11-25 MED ORDER — OXYCODONE HCL 15 MG PO TABS: 15.0000 mg | ORAL_TABLET | ORAL | 0 refills | Status: DC | PRN

## 2018-11-25 NOTE — Progress Notes (Signed)
   Subjective:    Patient ID: Shane Reese, male    DOB: 03/03/1966, 53 y.o.   MRN: [id number]  HPI Here for pain management. He is doing well.  Indication for chronic opioid: low back pain Medication and dose: Oxycodone 15 mg  # pills per month: 180 Last UDS date: 11-25-18 Opioid Treatment Agreement signed (Y/N): 11-25-18 Opioid Treatment Agreement last reviewed with patient:  11-25-18 NCCSRS reviewed this encounter (include red flags):  11-25-18    Review of Systems  Constitutional: Negative.   Respiratory: Negative.   Cardiovascular: Negative.   Musculoskeletal: Positive for back pain.       Objective:   Physical Exam Constitutional:      Appearance: Normal appearance.  Cardiovascular:     Rate and Rhythm: Normal rate and regular rhythm.     Pulses: Normal pulses.     Heart sounds: Normal heart sounds.  Pulmonary:     Effort: Pulmonary effort is normal.     Breath sounds: Normal breath sounds.  Neurological:     General: No focal deficit present.     Mental Status: He is alert. Mental status is at baseline.           Assessment & Plan:  Pain management. Meds were refilled.  Gershon Crane, MD

## 2018-11-29 LAB — PAIN MGMT, PROFILE 8 W/CONF, U
6 ACETYLMORPHINE: NEGATIVE ng/mL (ref <10)
Alcohol Metabolites: NEGATIVE ng/mL (ref <500)
Amphetamine: 8835 ng/mL — ABNORMAL HIGH (ref <250)
Amphetamines: POSITIVE ng/mL — AB (ref <500)
BENZODIAZEPINES: NEGATIVE ng/mL (ref <100)
Buprenorphine, Urine: NEGATIVE ng/mL (ref <5)
COCAINE METABOLITE: NEGATIVE ng/mL (ref <150)
CREATININE: 151 mg/dL
Codeine: NEGATIVE ng/mL (ref <50)
HYDROCODONE: NEGATIVE ng/mL (ref <50)
HYDROMORPHONE: NEGATIVE ng/mL (ref <50)
MDMA: NEGATIVE ng/mL (ref <500)
Marijuana Metabolite: NEGATIVE ng/mL (ref <20)
Methamphetamine: NEGATIVE ng/mL (ref <250)
Morphine: NEGATIVE ng/mL (ref <50)
NOROXYCODONE: 15328 ng/mL — AB (ref <50)
Norhydrocodone: NEGATIVE ng/mL (ref <50)
OXIDANT: NEGATIVE ug/mL (ref <200)
OXYCODONE: 11037 ng/mL — AB (ref <50)
Opiates: NEGATIVE ng/mL (ref <100)
Oxycodone: POSITIVE ng/mL — AB (ref <100)
Oxymorphone: >20000 ng/mL — ABNORMAL HIGH (ref <50)
PH: 6.96 (ref 4.5–9.0)

## 2019-01-06 ENCOUNTER — Encounter: Payer: Self-pay | Admitting: Family Medicine

## 2019-01-06 ENCOUNTER — Other Ambulatory Visit: Payer: Self-pay | Admitting: *Deleted

## 2019-01-06 NOTE — Telephone Encounter (Signed)
Dr. Clent Ridges please advise on the refill of the adderall.  The oxy has refills at the pharmacy.  Thanks

## 2019-01-07 ENCOUNTER — Encounter: Payer: Self-pay | Admitting: Family Medicine

## 2019-01-07 MED ORDER — AMPHETAMINE-DEXTROAMPHETAMINE 20 MG PO TABS: 20.0000 mg | ORAL_TABLET | Freq: Three times a day (TID) | ORAL | 0 refills | Status: DC

## 2019-01-07 NOTE — Telephone Encounter (Signed)
Done

## 2019-01-13 ENCOUNTER — Encounter: Payer: Self-pay | Admitting: Family Medicine

## 2019-01-14 MED ORDER — LORAZEPAM 1 MG PO TABS: ORAL_TABLET | ORAL | 2 refills | Status: DC

## 2019-01-14 NOTE — Telephone Encounter (Signed)
Dr. Clent Ridges please advise. On refill. thanks

## 2019-01-14 NOTE — Telephone Encounter (Signed)
Done

## 2019-03-07 ENCOUNTER — Encounter: Payer: Self-pay | Admitting: Family Medicine

## 2019-03-07 NOTE — Telephone Encounter (Signed)
Dr. Fry please advise. Thanks  

## 2019-03-07 NOTE — Telephone Encounter (Signed)
Before I can refill these, please print out a PMP report for me to review

## 2019-03-10 MED ORDER — AMPHETAMINE-DEXTROAMPHETAMINE 20 MG PO TABS: 20.0000 mg | ORAL_TABLET | Freq: Three times a day (TID) | ORAL | 0 refills | Status: DC

## 2019-03-10 NOTE — Telephone Encounter (Signed)
I sent the refills for Adderall to [facility]. He is not due for Oxycodone refills until July 10. He also needs a PMV to have these refills sent

## 2019-03-10 NOTE — Telephone Encounter (Signed)
Print out given to Dr. Sarajane Jews.  Please advise. Thanks

## 2019-03-17 ENCOUNTER — Ambulatory Visit (INDEPENDENT_AMBULATORY_CARE_PROVIDER_SITE_OTHER): Payer: BC Managed Care – PPO | Admitting: Family Medicine

## 2019-03-17 ENCOUNTER — Encounter: Payer: Self-pay | Admitting: Family Medicine

## 2019-03-17 ENCOUNTER — Other Ambulatory Visit: Payer: Self-pay

## 2019-03-17 DIAGNOSIS — M5440 Lumbago with sciatica, unspecified side: Secondary | ICD-10-CM

## 2019-03-17 DIAGNOSIS — G8929 Other chronic pain: Secondary | ICD-10-CM

## 2019-03-17 DIAGNOSIS — F1190 Opioid use, unspecified, uncomplicated: Secondary | ICD-10-CM

## 2019-03-17 MED ORDER — OXYCODONE HCL 15 MG PO TABS: 15.0000 mg | ORAL_TABLET | ORAL | 0 refills | Status: DC | PRN

## 2019-03-17 NOTE — Progress Notes (Signed)
Subjective:    Patient ID: Shane Reese, male    DOB: [birth year], 53 y.o.   MRN: [id number]  HPI Virtual Visit via Video Note  I connected with the patient on 03/17/19 at 10:30 AM EDT by a video enabled telemedicine application and verified that I am speaking with the correct person using two identifiers.  Location patient: home Location provider:work or home office Persons participating in the virtual visit: patient, provider  I discussed the limitations of evaluation and management by telemedicine and the availability of in person appointments. The patient expressed understanding and agreed to proceed.   HPI: Here for pain management. He is doing well.  Indication for chronic opioid: low back pain Medication and dose: Oxycodone 15 mg # pills per month: 180 Last UDS date: 11-25-18 Opioid Treatment Agreement signed (Y/N): 11-25-18 Opioid Treatment Agreement last reviewed with patient:  03-17-19 NCCSRS reviewed this encounter (include red flags):  03-17-19    ROS: See pertinent positives and negatives per HPI.  Past Medical History:  Diagnosis Date  . ADHD (attention deficit hyperactivity disorder)   . Alcohol dependence ([place])    sees [employer].  Abused cocaine and narcotics in the past   . Depression   . Drug dependence   . ED (erectile dysfunction)     Past Surgical History:  Procedure Laterality Date  . l lower leg fx surgery      History reviewed. No pertinent family history.   Current Outpatient Medications:  .  [START ON 05/10/2019] amphetamine-dextroamphetamine (ADDERALL) 20 MG tablet, Take 1 tablet (20 mg total) by mouth 3 (three) times daily for 30 days., Disp: 90 tablet, Rfl: 0 .  LORazepam (ATIVAN) 1 MG tablet, TAKE ONE TABLET EVERY 6 HOURS AS NEEDED., Disp: 60 tablet, Rfl: 2 .  omeprazole (PRILOSEC) 40 MG capsule, Take 1 capsule (40 mg total) by mouth daily., Disp: 90 capsule, Rfl: 3 .  [START ON 05/18/2019] oxyCODONE (ROXICODONE) 15 MG  immediate release tablet, Take 1 tablet (15 mg total) by mouth every 4 (four) hours as needed for pain., Disp: 180 tablet, Rfl: 0 .  tadalafil (ADCIRCA/CIALIS) 20 MG tablet, Take 1 tablet (20 mg total) by mouth daily as needed for erectile dysfunction., Disp: 30 tablet, Rfl: 5  EXAM:  VITALS per patient if applicable:  GENERAL: alert, oriented, appears well and in no acute distress  HEENT: atraumatic, conjunttiva clear, no obvious abnormalities on inspection of external nose and ears  NECK: normal movements of the head and neck  LUNGS: on inspection no signs of respiratory distress, breathing rate appears normal, no obvious gross SOB, gasping or wheezing  CV: no obvious cyanosis  MS: moves all visible extremities without noticeable abnormality  PSYCH/NEURO: pleasant and cooperative, no obvious depression or anxiety, speech and thought processing grossly intact  ASSESSMENT AND PLAN: Pain management, meds were refilled.  Alysia Penna, MD  Discussed the following assessment and plan:  No diagnosis found.     I discussed the assessment and treatment plan with the patient. The patient was provided an opportunity to ask questions and all were answered. The patient agreed with the plan and demonstrated an understanding of the instructions.   The patient was advised to call back or seek an in-person evaluation if the symptoms worsen or if the condition fails to improve as anticipated.     Review of Systems     Objective:   Physical Exam        Assessment & Plan:

## 2019-04-22 DIAGNOSIS — M7541 Impingement syndrome of right shoulder: Secondary | ICD-10-CM | POA: Diagnosis not present

## 2019-04-22 DIAGNOSIS — M9907 Segmental and somatic dysfunction of upper extremity: Secondary | ICD-10-CM | POA: Diagnosis not present

## 2019-04-22 DIAGNOSIS — M9901 Segmental and somatic dysfunction of cervical region: Secondary | ICD-10-CM | POA: Diagnosis not present

## 2019-04-22 DIAGNOSIS — M9902 Segmental and somatic dysfunction of thoracic region: Secondary | ICD-10-CM | POA: Diagnosis not present

## 2019-06-05 ENCOUNTER — Encounter: Payer: Self-pay | Admitting: Family Medicine

## 2019-06-06 MED ORDER — AMPHETAMINE-DEXTROAMPHETAMINE 20 MG PO TABS: 20.0000 mg | ORAL_TABLET | Freq: Three times a day (TID) | ORAL | 0 refills | Status: DC

## 2019-06-06 NOTE — Telephone Encounter (Signed)
Done

## 2019-06-13 ENCOUNTER — Encounter: Payer: Self-pay | Admitting: Family Medicine

## 2019-06-16 MED ORDER — OXYCODONE HCL 15 MG PO TABS: 15.0000 mg | ORAL_TABLET | ORAL | 0 refills | Status: AC | PRN

## 2019-06-16 NOTE — Telephone Encounter (Signed)
Okay for early refill.

## 2019-06-16 NOTE — Telephone Encounter (Signed)
I sent in a 5 days supply

## 2019-06-20 ENCOUNTER — Other Ambulatory Visit: Payer: Self-pay

## 2019-06-20 ENCOUNTER — Ambulatory Visit: Payer: BC Managed Care – PPO | Admitting: Family Medicine

## 2019-06-23 ENCOUNTER — Other Ambulatory Visit: Payer: Self-pay

## 2019-06-23 ENCOUNTER — Encounter: Payer: Self-pay | Admitting: Family Medicine

## 2019-06-23 ENCOUNTER — Ambulatory Visit (INDEPENDENT_AMBULATORY_CARE_PROVIDER_SITE_OTHER): Payer: BC Managed Care – PPO | Admitting: Family Medicine

## 2019-06-23 DIAGNOSIS — G8929 Other chronic pain: Secondary | ICD-10-CM | POA: Diagnosis not present

## 2019-06-23 DIAGNOSIS — F1190 Opioid use, unspecified, uncomplicated: Secondary | ICD-10-CM

## 2019-06-23 DIAGNOSIS — M5440 Lumbago with sciatica, unspecified side: Secondary | ICD-10-CM | POA: Diagnosis not present

## 2019-06-23 MED ORDER — OXYCODONE HCL 15 MG PO TABS: 15.0000 mg | ORAL_TABLET | ORAL | 0 refills | Status: AC | PRN

## 2019-06-23 MED ORDER — OXYCODONE HCL 15 MG PO TABS: 15.0000 mg | ORAL_TABLET | ORAL | 0 refills | Status: DC | PRN

## 2019-06-23 NOTE — Progress Notes (Signed)
   Subjective:    Patient ID: Shane Reese, male    DOB: [birth year], 53 y.o.   MRN: [id number]  HPI Virtual Visit via Video Note  I connected with the patient on 06/23/19 at  1:30 PM EDT by a video enabled telemedicine application and verified that I am speaking with the correct person using two identifiers.  Location patient: home Location provider:work or home office Persons participating in the virtual visit: patient, provider  I discussed the limitations of evaluation and management by telemedicine and the availability of in person appointments. The patient expressed understanding and agreed to proceed.   HPI: Here for pain management. He is doing well.  Indication for chronic opioid: low back pain Medication and dose: Oxycodone 15 mg # pills per month: 180 Last UDS date: 11-25-18 Opioid Treatment Agreement signed (Y/N): 11-25-18 Opioid Treatment Agreement last reviewed with patient:  06-23-19 NCCSRS reviewed this encounter (include red flags):  06-23-19    ROS: See pertinent positives and negatives per HPI.  Past Medical History:  Diagnosis Date  . ADHD (attention deficit hyperactivity disorder)   . Alcohol dependence ([place])    sees [employer].  Abused cocaine and narcotics in the past   . Depression   . Drug dependence   . ED (erectile dysfunction)     Past Surgical History:  Procedure Laterality Date  . l lower leg fx surgery      History reviewed. No pertinent family history.   Current Outpatient Medications:  .  [START ON 08/09/2019] amphetamine-dextroamphetamine (ADDERALL) 20 MG tablet, Take 1 tablet (20 mg total) by mouth 3 (three) times daily., Disp: 90 tablet, Rfl: 0 .  LORazepam (ATIVAN) 1 MG tablet, TAKE ONE TABLET EVERY 6 HOURS AS NEEDED., Disp: 60 tablet, Rfl: 2 .  omeprazole (PRILOSEC) 40 MG capsule, Take 1 capsule (40 mg total) by mouth daily., Disp: 90 capsule, Rfl: 3 .  tadalafil (ADCIRCA/CIALIS) 20 MG tablet, Take 1 tablet (20 mg  total) by mouth daily as needed for erectile dysfunction., Disp: 30 tablet, Rfl: 5  EXAM:  VITALS per patient if applicable:  GENERAL: alert, oriented, appears well and in no acute distress  HEENT: atraumatic, conjunttiva clear, no obvious abnormalities on inspection of external nose and ears  NECK: normal movements of the head and neck  LUNGS: on inspection no signs of respiratory distress, breathing rate appears normal, no obvious gross SOB, gasping or wheezing  CV: no obvious cyanosis  MS: moves all visible extremities without noticeable abnormality  PSYCH/NEURO: pleasant and cooperative, no obvious depression or anxiety, speech and thought processing grossly intact  ASSESSMENT AND PLAN: Pain management, meds were refilled.  Alysia Penna, MD  Discussed the following assessment and plan:  No diagnosis found.     I discussed the assessment and treatment plan with the patient. The patient was provided an opportunity to ask questions and all were answered. The patient agreed with the plan and demonstrated an understanding of the instructions.   The patient was advised to call back or seek an in-person evaluation if the symptoms worsen or if the condition fails to improve as anticipated.     Review of Systems     Objective:   Physical Exam        Assessment & Plan:

## 2019-06-24 ENCOUNTER — Encounter: Payer: Self-pay | Admitting: Family Medicine

## 2019-06-24 DIAGNOSIS — Z1211 Encounter for screening for malignant neoplasm of colon: Secondary | ICD-10-CM

## 2019-06-26 NOTE — Telephone Encounter (Signed)
Okay to order test?

## 2019-06-27 NOTE — Telephone Encounter (Signed)
Yes please order the Cologuard

## 2019-06-30 NOTE — Telephone Encounter (Signed)
This has been taking care of.

## 2019-07-01 ENCOUNTER — Encounter: Payer: Self-pay | Admitting: Family Medicine

## 2019-07-08 DIAGNOSIS — Z20828 Contact with and (suspected) exposure to other viral communicable diseases: Secondary | ICD-10-CM | POA: Diagnosis not present

## 2019-07-14 DIAGNOSIS — Z20828 Contact with and (suspected) exposure to other viral communicable diseases: Secondary | ICD-10-CM | POA: Diagnosis not present

## 2019-07-17 ENCOUNTER — Encounter: Payer: Self-pay | Admitting: Family Medicine

## 2019-09-19 ENCOUNTER — Encounter: Payer: Self-pay | Admitting: Family Medicine

## 2019-09-19 MED ORDER — AMPHETAMINE-DEXTROAMPHETAMINE 20 MG PO TABS: 20.0000 mg | ORAL_TABLET | Freq: Three times a day (TID) | ORAL | 0 refills | Status: DC

## 2019-09-19 NOTE — Telephone Encounter (Signed)
I refilled the Adderall. Make a virtual OV on Monday for a PMV for the oxycodone

## 2019-09-22 ENCOUNTER — Encounter: Payer: Self-pay | Admitting: Family Medicine

## 2019-09-22 NOTE — Telephone Encounter (Signed)
He will need a PMV to refill the Oxycodone, and we can do this virtually. Set one up asap

## 2019-09-22 NOTE — Telephone Encounter (Signed)
Last filled 08/23/2019

## 2019-09-23 ENCOUNTER — Telehealth (INDEPENDENT_AMBULATORY_CARE_PROVIDER_SITE_OTHER): Payer: BC Managed Care – PPO | Admitting: Family Medicine

## 2019-09-23 ENCOUNTER — Other Ambulatory Visit: Payer: Self-pay

## 2019-09-23 DIAGNOSIS — G8929 Other chronic pain: Secondary | ICD-10-CM | POA: Diagnosis not present

## 2019-09-23 DIAGNOSIS — F1190 Opioid use, unspecified, uncomplicated: Secondary | ICD-10-CM

## 2019-09-23 DIAGNOSIS — M5440 Lumbago with sciatica, unspecified side: Secondary | ICD-10-CM | POA: Diagnosis not present

## 2019-09-23 MED ORDER — OXYCODONE HCL 15 MG PO TABS: 15.0000 mg | ORAL_TABLET | ORAL | 0 refills | Status: DC | PRN

## 2019-09-23 NOTE — Telephone Encounter (Signed)
Virtual appointment scheduled for 09/23/19.

## 2019-09-23 NOTE — Progress Notes (Signed)
Virtual Visit via Telephone Note  I connected with the patient on 09/23/19 at 10:45 AM EST by telephone and verified that I am speaking with the correct person using two identifiers.   I discussed the limitations, risks, security and privacy concerns of performing an evaluation and management service by telephone and the availability of in person appointments. I also discussed with the patient that there may be a patient responsible charge related to this service. The patient expressed understanding and agreed to proceed.  Location patient: home Location provider: work or home office Participants present for the call: patient, provider Patient did not have a visit in the prior 7 days to address this/these issue(s).   History of Present Illness: Here for pain management, he is doing well.  Indication for chronic opioid: low back pain Medication and dose: Oxycodone 15 mg # pills per month: 180 Last UDS date: 11-25-18 Opioid Treatment Agreement signed (Y/N): 11-25-18 Opioid Treatment Agreement last reviewed with patient:  09-23-19 NCCSRS reviewed this encounter (include red flags): Yes   Observations/Objective: Patient sounds cheerful and well on the phone. I do not appreciate any SOB. Speech and thought processing are grossly intact. Patient reported vitals:  Assessment and Plan: Pain management, meds were refilled. Gershon Crane, MD   Follow Up Instructions:     [phone] 5-10 [phone] 11-20 [phone] 21-30 I did not refer this patient for an OV in the next 24 hours for this/these issue(s).  I discussed the assessment and treatment plan with the patient. The patient was provided an opportunity to ask questions and all were answered. The patient agreed with the plan and demonstrated an understanding of the instructions.   The patient was advised to call back or seek an in-person evaluation if the symptoms worsen or if the condition fails to improve as anticipated.  I provided 10 minutes  of non-face-to-face time during this encounter.   Gershon Crane, MD

## 2019-10-20 ENCOUNTER — Encounter: Payer: Self-pay | Admitting: Family Medicine

## 2019-10-20 MED ORDER — OMEPRAZOLE 40 MG PO CPDR: 40.0000 mg | DELAYED_RELEASE_CAPSULE | Freq: Every day | ORAL | 3 refills | Status: DC

## 2019-11-25 LAB — COLOGUARD: Cologuard: NEGATIVE

## 2019-11-28 ENCOUNTER — Other Ambulatory Visit: Payer: Self-pay

## 2019-11-28 LAB — COLOGUARD: COLOGUARD: NEGATIVE

## 2019-11-28 LAB — EXTERNAL GENERIC LAB PROCEDURE: COLOGUARD: NEGATIVE

## 2019-12-01 ENCOUNTER — Encounter: Payer: Self-pay | Admitting: Family Medicine

## 2019-12-01 ENCOUNTER — Ambulatory Visit (INDEPENDENT_AMBULATORY_CARE_PROVIDER_SITE_OTHER): Payer: BC Managed Care – PPO | Admitting: Family Medicine

## 2019-12-01 VITALS — BP 150/100 | HR 119 | Temp 98.7°F | Ht 74.0 in | Wt 222.5 lb

## 2019-12-01 DIAGNOSIS — R030 Elevated blood-pressure reading, without diagnosis of hypertension: Secondary | ICD-10-CM

## 2019-12-01 DIAGNOSIS — F1190 Opioid use, unspecified, uncomplicated: Secondary | ICD-10-CM

## 2019-12-01 DIAGNOSIS — R002 Palpitations: Secondary | ICD-10-CM

## 2019-12-01 MED ORDER — METOPROLOL SUCCINATE ER 50 MG PO TB24: 50.0000 mg | ORAL_TABLET | Freq: Every day | ORAL | 3 refills | Status: DC

## 2019-12-01 NOTE — Progress Notes (Signed)
   Subjective:    Patient ID: Shane Reese, male    DOB: 31-Oct-1965, 54 y.o.   MRN: [id number]  HPI Here for 2 weeks of rapid heartbeats and elevated BP. At home he has been getting pulse rates of 100-120 and BP up to 160/100. He has felt a mild "tingle" in the left chest area but no pressure or pain. No SOB or sweats or nausea. No change in medications.    Review of Systems  Constitutional: Negative.   Respiratory: Negative.   Cardiovascular: Positive for palpitations. Negative for chest pain and leg swelling.  Gastrointestinal: Negative.   Genitourinary: Negative.   Neurological: Negative.        Objective:   Physical Exam Cardiovascular:     Rate and Rhythm: Regular rhythm. Tachycardia present.     Pulses: Normal pulses.     Heart sounds: Normal heart sounds. No murmur. No friction rub. No gallop.      Comments: EKG today shows sinus tachycardia  Pulmonary:     Effort: Pulmonary effort is normal.     Breath sounds: Normal breath sounds.  Musculoskeletal:     Right lower leg: No edema.     Left lower leg: No edema.  Neurological:     General: No focal deficit present.     Mental Status: He is alert and oriented to person, place, and time.  Psychiatric:        Thought Content: Thought content normal.     Comments: Anxious            Assessment & Plan:  He has elevated BP and tachycardia of uncertain etiology. He is anxious, but I do nto think anxiety is the sole cause of this. He will start on Metoprolol succinate 50 mg daily. Get labs today. Recheck in 2 weeks. Gershon Crane, MD

## 2019-12-02 LAB — HEPATIC FUNCTION PANEL
ALT: 34 U/L (ref 0–53)
AST: 24 U/L (ref 0–37)
Albumin: 4.4 g/dL (ref 3.5–5.2)
Alkaline Phosphatase: 47 U/L (ref 39–117)
Bilirubin, Direct: 0.1 mg/dL (ref 0.0–0.3)
Total Bilirubin: 0.8 mg/dL (ref 0.2–1.2)
Total Protein: 6.7 g/dL (ref 6.0–8.3)

## 2019-12-02 LAB — CBC WITH DIFFERENTIAL/PLATELET
Basophils Absolute: 0 10*3/uL (ref 0.0–0.1)
Basophils Relative: 0.7 % (ref 0.0–3.0)
Eosinophils Absolute: 0.1 10*3/uL (ref 0.0–0.7)
Eosinophils Relative: 1.7 % (ref 0.0–5.0)
HCT: 45.4 % (ref 39.0–52.0)
Hemoglobin: 15.2 g/dL (ref 13.0–17.0)
Lymphocytes Relative: 29.4 % (ref 12.0–46.0)
Lymphs Abs: 2.1 10*3/uL (ref 0.7–4.0)
MCHC: 33.4 g/dL (ref 30.0–36.0)
MCV: 91.6 fl (ref 78.0–100.0)
Monocytes Absolute: 0.6 10*3/uL (ref 0.1–1.0)
Monocytes Relative: 8.3 % (ref 3.0–12.0)
Neutro Abs: 4.2 10*3/uL (ref 1.4–7.7)
Neutrophils Relative %: 59.9 % (ref 43.0–77.0)
Platelets: 203 10*3/uL (ref 150.0–400.0)
RBC: 4.96 Mil/uL (ref 4.22–5.81)
RDW: 14.3 % (ref 11.5–15.5)
WBC: 7 10*3/uL (ref 4.0–10.5)

## 2019-12-02 LAB — BASIC METABOLIC PANEL
BUN: 9 mg/dL (ref 6–23)
CO2: 31 mEq/L (ref 19–32)
Calcium: 9.5 mg/dL (ref 8.4–10.5)
Chloride: 100 mEq/L (ref 96–112)
Creatinine, Ser: 1.1 mg/dL (ref 0.40–1.50)
GFR: 70.42 mL/min (ref >60.00)
Glucose, Bld: 118 mg/dL — ABNORMAL HIGH (ref 70–99)
Potassium: 3.9 mEq/L (ref 3.5–5.1)
Sodium: 138 mEq/L (ref 135–145)

## 2019-12-02 LAB — T4, FREE: Free T4: 0.91 ng/dL (ref 0.60–1.60)

## 2019-12-02 LAB — T3, FREE: T3, Free: 3.6 pg/mL (ref 2.3–4.2)

## 2019-12-02 LAB — TSH: TSH: 1.46 u[IU]/mL (ref 0.35–4.50)

## 2019-12-03 LAB — PAIN MGMT, PROFILE 8 W/CONF, U
6 Acetylmorphine: NEGATIVE ng/mL
Alcohol Metabolites: NEGATIVE ng/mL (ref <500)
Amphetamine: 645 ng/mL
Amphetamines: POSITIVE ng/mL
Benzodiazepines: NEGATIVE ng/mL
Buprenorphine, Urine: NEGATIVE ng/mL
Cocaine Metabolite: NEGATIVE ng/mL
Codeine: NEGATIVE ng/mL
Creatinine: 13.1 mg/dL
Hydrocodone: NEGATIVE ng/mL
Hydromorphone: NEGATIVE ng/mL
MDMA: NEGATIVE ng/mL
Marijuana Metabolite: NEGATIVE ng/mL
Methamphetamine: NEGATIVE ng/mL
Morphine: NEGATIVE ng/mL
Norhydrocodone: NEGATIVE ng/mL
Noroxycodone: 2089 ng/mL
Opiates: NEGATIVE ng/mL
Oxidant: NEGATIVE ug/mL
Oxycodone: 1569 ng/mL
Oxycodone: POSITIVE ng/mL
Oxymorphone: 1806 ng/mL
Specific Gravity: 1.001 — ABNORMAL LOW (ref >=1.0)
pH: 7.2 (ref 4.5–9.0)

## 2019-12-17 ENCOUNTER — Encounter: Payer: Self-pay | Admitting: Family Medicine

## 2019-12-19 ENCOUNTER — Telehealth (INDEPENDENT_AMBULATORY_CARE_PROVIDER_SITE_OTHER): Payer: BC Managed Care – PPO | Admitting: Family Medicine

## 2019-12-19 ENCOUNTER — Encounter: Payer: Self-pay | Admitting: Family Medicine

## 2019-12-19 ENCOUNTER — Other Ambulatory Visit: Payer: Self-pay | Admitting: Family Medicine

## 2019-12-19 DIAGNOSIS — G8929 Other chronic pain: Secondary | ICD-10-CM

## 2019-12-19 DIAGNOSIS — F1190 Opioid use, unspecified, uncomplicated: Secondary | ICD-10-CM

## 2019-12-19 DIAGNOSIS — M5440 Lumbago with sciatica, unspecified side: Secondary | ICD-10-CM | POA: Diagnosis not present

## 2019-12-19 MED ORDER — METOPROLOL SUCCINATE ER 100 MG PO TB24: 100.0000 mg | ORAL_TABLET | Freq: Every day | ORAL | 3 refills | Status: DC

## 2019-12-19 MED ORDER — OXYCODONE HCL 15 MG PO TABS: 15.0000 mg | ORAL_TABLET | ORAL | 0 refills | Status: DC | PRN

## 2019-12-19 NOTE — Telephone Encounter (Signed)
I spoke to Shane Reese and after taking 75 mg of Metoprolol a day his heart rates are down to the 80s and the BP is 140s over 90s. He feels better. We will increase this to 100 mg daily. He will check back in a few weeks.

## 2019-12-19 NOTE — Progress Notes (Signed)
Virtual Visit via Telephone Note  I connected with the patient on 12/19/19 at  4:30 PM EDT by telephone and verified that I am speaking with the correct person using two identifiers.   I discussed the limitations, risks, security and privacy concerns of performing an evaluation and management service by telephone and the availability of in person appointments. I also discussed with the patient that there may be a patient responsible charge related to this service. The patient expressed understanding and agreed to proceed.  Location patient: home Location provider: work or home office Participants present for the call: patient, provider Patient did not have a visit in the prior 7 days to address this/these issue(s).   History of Present Illness: Here for pain management. He is doing well.  Indication for chronic opioid: low back pain Medication and dose: Oxycodone 15 mg # pills per month: 180 Last UDS date: 12-01-19 Opioid Treatment Agreement signed (Y/N): 11-25-18 Opioid Treatment Agreement last reviewed with patient:  12-19-19 NCCSRS reviewed this encounter (include red flags): Yes    Observations/Objective: Patient sounds cheerful and well on the phone. I do not appreciate any SOB. Speech and thought processing are grossly intact. Patient reported vitals:  Assessment and Plan: Pain management, meds were refilled.  Gershon Crane, MD   Follow Up Instructions:     [phone] 5-10 [phone] 11-20 [phone] 21-30 I did not refer this patient for an OV in the next 24 hours for this/these issue(s).  I discussed the assessment and treatment plan with the patient. The patient was provided an opportunity to ask questions and all were answered. The patient agreed with the plan and demonstrated an understanding of the instructions.   The patient was advised to call back or seek an in-person evaluation if the symptoms worsen or if the condition fails to improve as anticipated.  I provided 11 minutes  of non-face-to-face time during this encounter.   Gershon Crane, MD

## 2019-12-19 NOTE — Telephone Encounter (Signed)
Please advise. Pt is out of medication early due to change in directions. Ok to send in a new Rx with the new directions?

## 2019-12-19 NOTE — Telephone Encounter (Signed)
He will need a PMV for this. Please set up an OV for a telephone call and I will call him.

## 2020-01-01 ENCOUNTER — Encounter: Payer: Self-pay | Admitting: Family Medicine

## 2020-01-01 DIAGNOSIS — E291 Testicular hypofunction: Secondary | ICD-10-CM

## 2020-01-02 ENCOUNTER — Encounter: Payer: Self-pay | Admitting: Family Medicine

## 2020-01-05 MED ORDER — AMPHETAMINE-DEXTROAMPHETAMINE 20 MG PO TABS: 20.0000 mg | ORAL_TABLET | Freq: Three times a day (TID) | ORAL | 0 refills | Status: DC

## 2020-01-05 NOTE — Telephone Encounter (Signed)
Please advise 

## 2020-01-05 NOTE — Telephone Encounter (Signed)
Done

## 2020-01-05 NOTE — Telephone Encounter (Signed)
Okay for refill?  Last refill 11/17/2019 with 90 day refill   Please advise

## 2020-01-05 NOTE — Telephone Encounter (Signed)
Noted  

## 2020-01-05 NOTE — Telephone Encounter (Signed)
Before we can prescribe this, he needs to have his level checked. I put in the order, so he needs to make a lab appt for this

## 2020-01-05 NOTE — Telephone Encounter (Signed)
See my note about the lab level

## 2020-01-06 NOTE — Telephone Encounter (Signed)
Called pt several times yesterday and today no answer or vm set up.

## 2020-01-07 NOTE — Telephone Encounter (Signed)
FYI

## 2020-01-14 ENCOUNTER — Encounter: Payer: Self-pay | Admitting: Family Medicine

## 2020-01-14 DIAGNOSIS — R002 Palpitations: Secondary | ICD-10-CM

## 2020-01-16 NOTE — Telephone Encounter (Signed)
I ordered the ECHO (ultrasound)

## 2020-01-19 ENCOUNTER — Encounter (HOSPITAL_COMMUNITY): Payer: Self-pay | Admitting: *Deleted

## 2020-01-19 ENCOUNTER — Emergency Department (HOSPITAL_COMMUNITY): Payer: BC Managed Care – PPO

## 2020-01-19 ENCOUNTER — Other Ambulatory Visit: Payer: Self-pay

## 2020-01-19 ENCOUNTER — Emergency Department (HOSPITAL_COMMUNITY)
Admission: EM | Admit: 2020-01-19 | Discharge: 2020-01-20 | Disposition: A | Discharge status: Home / Self Care | Payer: BC Managed Care – PPO | Attending: Emergency Medicine | Admitting: Emergency Medicine

## 2020-01-19 DIAGNOSIS — F909 Attention-deficit hyperactivity disorder, unspecified type: Secondary | ICD-10-CM | POA: Diagnosis not present

## 2020-01-19 DIAGNOSIS — R0602 Shortness of breath: Secondary | ICD-10-CM | POA: Diagnosis not present

## 2020-01-19 DIAGNOSIS — R0789 Other chest pain: Secondary | ICD-10-CM | POA: Diagnosis not present

## 2020-01-19 DIAGNOSIS — Z79899 Other long term (current) drug therapy: Secondary | ICD-10-CM | POA: Insufficient documentation

## 2020-01-19 DIAGNOSIS — I10 Essential (primary) hypertension: Secondary | ICD-10-CM | POA: Diagnosis not present

## 2020-01-19 DIAGNOSIS — R079 Chest pain, unspecified: Secondary | ICD-10-CM | POA: Diagnosis not present

## 2020-01-19 HISTORY — DX: Essential (primary) hypertension: I10

## 2020-01-19 LAB — TROPONIN I (HIGH SENSITIVITY)
Troponin I (High Sensitivity): 4 ng/L (ref <18)
Troponin I (High Sensitivity): 3 ng/L (ref <18)

## 2020-01-19 LAB — BASIC METABOLIC PANEL
Anion gap: 9 (ref 5–15)
BUN: 17 mg/dL (ref 6–20)
CO2: 28 mmol/L (ref 22–32)
Calcium: 9.3 mg/dL (ref 8.9–10.3)
Chloride: 102 mmol/L (ref 98–111)
Creatinine, Ser: 1.1 mg/dL (ref 0.61–1.24)
GFR calc Af Amer: >60 mL/min (ref >60)
GFR calc non Af Amer: >60 mL/min (ref >60)
Glucose, Bld: 109 mg/dL — ABNORMAL HIGH (ref 70–99)
Potassium: 4.4 mmol/L (ref 3.5–5.1)
Sodium: 139 mmol/L (ref 135–145)

## 2020-01-19 LAB — CBC
HCT: 48.2 % (ref 39.0–52.0)
Hemoglobin: 15.8 g/dL (ref 13.0–17.0)
MCH: 29.8 pg (ref 26.0–34.0)
MCHC: 32.8 g/dL (ref 30.0–36.0)
MCV: 90.8 fL (ref 80.0–100.0)
Platelets: 208 10*3/uL (ref 150–400)
RBC: 5.31 MIL/uL (ref 4.22–5.81)
RDW: 12.3 % (ref 11.5–15.5)
WBC: 8.7 10*3/uL (ref 4.0–10.5)
nRBC: 0 % (ref 0.0–0.2)

## 2020-01-19 MED ORDER — SODIUM CHLORIDE 0.9% FLUSH: 3.0000 mL | Freq: Once | INTRAVENOUS | Status: DC

## 2020-01-19 NOTE — ED Triage Notes (Addendum)
Pt from home for CP, SOB x 3 weeks. Pt has an echo appt through his PCP. Today, pt reports worsening sob and chest pain; described as tingling sensation to left/ middle chest

## 2020-01-20 ENCOUNTER — Encounter (HOSPITAL_COMMUNITY): Payer: Self-pay | Admitting: Student

## 2020-01-20 NOTE — Discharge Instructions (Addendum)
You were seen in the emergency department today for chest discomfort. Your work-up in the emergency department has been overall reassuring. Your labs have been fairly normal and or similar to previous blood work you have had done. Your EKG and the enzyme we use to check your heart did not show an acute heart attack at this time. Your chest x-ray was normal.    We would like you to follow up closely with your primary care provider and/or the cardiologist provided in your discharge instructions. We have placed a referral to cardiology for their office to call you to schedule an appointment. Return to the ER immediately should you experience any new or worsening symptoms including but not limited to return of pain, worsened pain, vomiting, shortness of breath, dizziness, lightheadedness, passing out, or any other concerns that you may have.

## 2020-01-20 NOTE — ED Provider Notes (Signed)
[facility] EMERGENCY DEPARTMENT Provider Note   CSN: [id number] Arrival date & time: 01/19/20  1913     History Chief Complaint  Patient presents with  . Chest Pain    Shane Reese is a 54 y.o. male with a history of hypertension, depression, and anxiety who presents to the ED with complaints of chest discomfort which has been occurring intermittently for the past few months.  Patient states that the discomfort is somewhat of a tingling aching sensation to the left chest which is nonradiating.  He states the discomfort occurs almost every day but not daily, last for a few hours at a time prior to resolution.  He cannot identify any specific triggers, not specific to exertion, deep breathing, or food.  He does not have any particular associated symptoms with the discomfort, denies nausea, vomiting, diaphoresis, dyspnea, lightheadedness, dizziness, or syncope.  Continues to deny dyspnea to me despite triage note. At times he notes that his heart rate is elevated on at home monitor, but this is not directly correlated with his chest discomfort. Today discomfort seemed a bit worse which prompted ER visit- discomfort from 3PM-5PM- none at present.  He has contacted his PCP who has planned to schedule outpatient echocardiogram. He has not seen cardiology previously. Denies leg pain/swelling, hemoptysis, recent surgery/trauma, recent long travel, hormone use, personal hx of cancer, or hx of DVT/PE.    HPI     Past Medical History:  Diagnosis Date  . ADHD (attention deficit hyperactivity disorder)   . Alcohol dependence ([facility])    sees [employer].  Abused cocaine and narcotics in the past   . Depression   . Drug dependence   . ED (erectile dysfunction)   . Hypertension     Patient Active Problem List   Diagnosis Date Noted  . Chronic low back pain 11/10/2015  . Chronic gingivitis 01/06/2014  . Adenopathy, cervical 01/06/2014  . Anterior cervical  adenopathy 12/22/2013  . ACUTE BRONCHITIS 12/15/2009  . DENTAL PAIN 12/15/2009  . ERECTILE DYSFUNCTION, ORGANIC 06/25/2009  . Attention deficit hyperactivity disorder (ADHD) 07/15/2007  . Depression with anxiety 06/10/2007    Past Surgical History:  Procedure Laterality Date  . l lower leg fx surgery         History reviewed. No pertinent family history.  Social History   Tobacco Use  . Smoking status: Never Smoker  . Smokeless tobacco: Never Used  Substance Use Topics  . Alcohol use: Yes    Alcohol/week: 0.0 standard drinks    Comment: occ  . Drug use: No    Home Medications Prior to Admission medications   Medication Sig Start Date End Date Taking? Authorizing Provider  amphetamine-dextroamphetamine (ADDERALL) 20 MG tablet Take 1 tablet (20 mg total) by mouth 3 (three) times daily. 03/06/20 04/05/20 Yes Nelwyn Salisbury, MD  aspirin EC 81 MG tablet Take 81 mg by mouth daily as needed for mild pain.   Yes [provider]  LORazepam (ATIVAN) 1 MG tablet TAKE ONE TABLET EVERY 6 HOURS AS NEEDED. Patient taking differently: Take 1 mg by mouth every 6 (six) hours as needed for anxiety or sleep.  01/14/19  Yes Nelwyn Salisbury, MD  metoprolol succinate (TOPROL-XL) 100 MG 24 hr tablet Take 1 tablet (100 mg total) by mouth daily. Take with or immediately following a meal. 12/19/19  Yes Nelwyn Salisbury, MD  omeprazole (PRILOSEC) 40 MG capsule Take 1 capsule (40 mg total) by mouth daily. 10/20/19  Yes Nelwyn Salisbury, MD  ondansetron (ZOFRAN-ODT) 8 MG disintegrating tablet Take 8 mg by mouth every 8 (eight) hours as needed for nausea/vomiting. 12/14/19  Yes [provider]  oxyCODONE (ROXICODONE) 15 MG immediate release tablet Take 1 tablet (15 mg total) by mouth every 4 (four) hours as needed for pain. 02/18/20 03/19/20 Yes Nelwyn Salisbury, MD  tadalafil (CIALIS) 20 MG tablet TAKE 1 TABLET(20 MG) BY MOUTH DAILY AS NEEDED Patient taking differently: Take 20 mg by mouth daily as needed  for erectile dysfunction.  12/19/19  Yes Nelwyn Salisbury, MD    Allergies    Patient has no known allergies.  Review of Systems   Review of Systems  Constitutional: Negative for chills, diaphoresis and fever.  Respiratory: Negative for shortness of breath.   Cardiovascular: Positive for chest pain. Negative for leg swelling.  Gastrointestinal: Negative for abdominal pain, nausea and vomiting.  Neurological: Negative for dizziness, syncope and numbness.  All other systems reviewed and are negative.   Physical Exam Updated Vital Signs BP (!) 135/94   Pulse 69   Temp 98.5 F (36.9 C)   Resp 12   Ht 6\' 2"  (1.88 m)   Wt 99.8 kg   SpO2 97%   BMI 28.25 kg/m   Physical Exam Vitals and nursing note reviewed.  Constitutional:      General: He is not in acute distress.    Appearance: He is well-developed. He is not toxic-appearing.  HENT:     Head: Normocephalic and atraumatic.  Eyes:     General:        Right eye: No discharge.        Left eye: No discharge.     Conjunctiva/sclera: Conjunctivae normal.  Cardiovascular:     Rate and Rhythm: Normal rate and regular rhythm.     Pulses:          Radial pulses are 2+ on the right side and 2+ on the left side.  Pulmonary:     Effort: Pulmonary effort is normal. No respiratory distress.     Breath sounds: Normal breath sounds. No wheezing, rhonchi or rales.  Chest:     Chest wall: No tenderness.  Abdominal:     General: There is no distension.     Palpations: Abdomen is soft.     Tenderness: There is no abdominal tenderness.  Musculoskeletal:     Cervical back: Neck supple.     Right lower leg: No tenderness. No edema.     Left lower leg: No tenderness. No edema.  Skin:    General: Skin is warm and dry.     Findings: No rash.  Neurological:     General: No focal deficit present.     Mental Status: He is alert.     Comments: Clear speech.   Psychiatric:        Behavior: Behavior normal.     ED Results / Procedures /  Treatments   Labs (all labs ordered are listed, but only abnormal results are displayed) Labs Reviewed  BASIC METABOLIC PANEL - Abnormal; Notable for the following components:      Result Value   Glucose, Bld 109 (*)    All other components within normal limits  CBC  TROPONIN I (HIGH SENSITIVITY)  TROPONIN I (HIGH SENSITIVITY)    EKG EKG Interpretation  Date/Time:  Monday Jan 19 2020 19:17:07 EDT Ventricular Rate:  80 PR Interval:  142 QRS Duration: 84 QT Interval:  362 QTC  Calculation: 417 R Axis:   74 Text Interpretation: Normal sinus rhythm Minimal voltage criteria for LVH, may be normal variant ( Sokolow-Lyon ) Nonspecific ST abnormality Abnormal ECG No significant change since last tracing Confirmed by Ward, Cyril Mourning [phone]) on 01/20/2020 1:41:59 AM   Radiology DG Chest 2 View  Result Date: 01/19/2020 CLINICAL DATA:  Chest pain and shortness of breath x3 weeks. EXAM: CHEST - 2 VIEW COMPARISON:  May 15, 2018 FINDINGS: There is no evidence of acute infiltrate, pleural effusion or pneumothorax. The heart size and mediastinal contours are within normal limits. The visualized skeletal structures are unremarkable. IMPRESSION: No active cardiopulmonary disease. Electronically Signed   By: Virgina Norfolk M.D.   On: 01/19/2020 19:53    Procedures Procedures (including critical care time)  Medications Ordered in ED Medications  sodium chloride flush (NS) 0.9 % injection 3 mL (has no administration in time range)    ED Course  I have reviewed the triage vital signs and the nursing notes.  Pertinent labs & imaging results that were available during my care of the patient were reviewed by me and considered in my medical decision making (see chart for details).    MDM Rules/Calculators/A&P                     Patient presents to the ED with complaints of chest discomfort. Nontoxic, vitals WNL with the exception of elevated BP- low suspicion for HTN emergency. Benign  physical exam. DDX: ACS, PE, dissection, pneumothorax, boerhaaves, pneumonia, anxiety, anemia, electrolyte derangement, arrhythmia.   Additional history obtained:  Additional history obtained from chart review, nursing note review, and patient's wife at bedside.  Lab Tests:  I Ordered, reviewed, and interpreted labs, which included:  CBC: No anemia or leukocytosis.  BMP: No significant electrolyte derangement.  Troponin: 4, 3 Imaging Studies ordered:  I ordered imaging studies which included CXR, I independently visualized and interpreted imaging which showed no acute cardiopulmonary disease.   ED Course:  HEAR score 3- low risk- EKG without significant change from prior, delta troponin negative, doubt ACS. Patient is low risk wells, doubt pulmonary embolism. Pain is not a tearing sensation, symmetric pulses, no widening of mediastinum on CXR, doubt dissection. Labs reassuring. Cardiac monitor reviewed, no notable arrhythmias or tachycardia. Patient has appeared hemodynamically stable throughout ER visit and appears safe for discharge with close PCP/cardiology follow up- ambulatory referral placed. I discussed results, treatment plan, need for follow-up, and return precautions with the patient & his wife at bedside. Provided opportunity for questions, patient & his wife confirmed understanding and are in agreement with plan.   Findings and plan of care discussed with supervising physician Dr. Leonides Schanz who is in agreement.   Portions of this note were generated with [employer]. Dictation errors may occur despite best attempts at proofreading.  Final Clinical Impression(s) / ED Diagnoses Final diagnoses:  Chest discomfort    Rx / DC Orders ED Discharge Orders         Ordered    Ambulatory referral to Cardiology     01/20/20 0226           Amaryllis Dyke, PA-C 01/20/20 0248    Ward, Delice Bison, DO 01/20/20 [id number]

## 2020-01-22 ENCOUNTER — Other Ambulatory Visit: Payer: Self-pay

## 2020-01-22 ENCOUNTER — Ambulatory Visit (HOSPITAL_COMMUNITY): Payer: BC Managed Care – PPO | Attending: Cardiovascular Disease

## 2020-01-22 DIAGNOSIS — R002 Palpitations: Secondary | ICD-10-CM | POA: Insufficient documentation

## 2020-01-28 ENCOUNTER — Other Ambulatory Visit: Payer: Self-pay

## 2020-01-28 ENCOUNTER — Ambulatory Visit (INDEPENDENT_AMBULATORY_CARE_PROVIDER_SITE_OTHER): Payer: BC Managed Care – PPO | Admitting: Cardiovascular Disease

## 2020-01-28 ENCOUNTER — Encounter: Payer: Self-pay | Admitting: Cardiovascular Disease

## 2020-01-28 ENCOUNTER — Encounter: Payer: Self-pay | Admitting: *Deleted

## 2020-01-28 DIAGNOSIS — G8929 Other chronic pain: Secondary | ICD-10-CM

## 2020-01-28 DIAGNOSIS — M5440 Lumbago with sciatica, unspecified side: Secondary | ICD-10-CM

## 2020-01-28 DIAGNOSIS — R0789 Other chest pain: Secondary | ICD-10-CM | POA: Diagnosis not present

## 2020-01-28 DIAGNOSIS — R002 Palpitations: Secondary | ICD-10-CM | POA: Diagnosis not present

## 2020-01-28 DIAGNOSIS — I519 Heart disease, unspecified: Secondary | ICD-10-CM

## 2020-01-28 DIAGNOSIS — I10 Essential (primary) hypertension: Secondary | ICD-10-CM

## 2020-01-28 DIAGNOSIS — I5189 Other ill-defined heart diseases: Secondary | ICD-10-CM

## 2020-01-28 DIAGNOSIS — F909 Attention-deficit hyperactivity disorder, unspecified type: Secondary | ICD-10-CM

## 2020-01-28 MED ORDER — LOSARTAN POTASSIUM 50 MG PO TABS: ORAL_TABLET | ORAL | 3 refills | Status: DC

## 2020-01-28 NOTE — Patient Instructions (Signed)
Medication Instructions:  BEGIN TAKING LOSARTAN -- FOR THE FIRST WEEK TAKE 1/2 TABLET (25MG ) IF BP IS CONSISTENTLY GREATER THANK 135 YOU CAN INCREASE TO 1 WHOLE TABLET OR (50MG )  *If you need a refill on your cardiac medications before your next appointment, please call your pharmacy*   Testing/Procedures: ZIO XT- Long Term Monitor Instructions   Your physician has requested you wear your ZIO patch monitor__14_days.   This is a single patch monitor.  [employer] supplies one patch monitor per enrollment.  Additional stickers are not available.   Please do not apply patch if you will be having a Nuclear Stress Test, Echocardiogram, Cardiac CT, MRI, or Chest Xray during the time frame you would be wearing the monitor. The patch cannot be worn during these tests.  You cannot remove and re-apply the ZIO XT patch monitor.   Your ZIO patch monitor will be sent USPS Priority mail from [facility] directly to your home address. The monitor may also be mailed to a PO BOX if home delivery is not available.   It may take 3-5 days to receive your monitor after you have been enrolled.   Once you have received you monitor, please review enclosed instructions.  Your monitor has already been registered assigning a specific monitor serial # to you.   Applying the monitor   Shave hair from upper left chest.   Hold abrader disc by orange tab.  Rub abrader in 40 strokes over left upper chest as indicated in your monitor instructions.   Clean area with 4 enclosed alcohol pads .  Use all pads to assure are is cleaned thoroughly.  Let dry.   Apply patch as indicated in monitor instructions.  Patch will be place under collarbone on left side of chest with arrow pointing upward.   Rub patch adhesive wings for 2 minutes.Remove white label marked "1".  Remove white label marked "2".  Rub patch adhesive wings for 2 additional minutes.   While looking in a mirror, press and release button in center of patch.   A small green light will flash 3-4 times .  This will be your only indicator the monitor has been turned on.     Do not shower for the first 24 hours.  You may shower after the first 24 hours.   Press button if you feel a symptom. You will hear a small click.  Record Date, Time and Symptom in the Patient Log Book.   When you are ready to remove patch, follow instructions on last 2 pages of Patient Log Book.  Stick patch monitor onto last page of Patient Log Book.   Place Patient Log Book in Green box.  Use locking tab on box and tape box closed securely.  The [employer] and [employer] has [employer] on it.  Please place in mailbox as soon as possible.  Your physician should have your test results approximately 7 days after the monitor has been mailed back to [facility].   Call [facility] at [phone] if you have questions regarding your ZIO XT patch monitor.  Call them immediately if you see an orange light blinking on your monitor.   If your monitor falls off in less than 4 days contact our Monitor department at [phone].  If your monitor becomes loose or falls off after 4 days call [employer] at [phone] for suggestions on securing your monitor.     Follow-Up: At [facility], you and your health needs are our  priority.  As part of our continuing mission to provide you with exceptional heart care, we have created designated Provider Care Teams.  These Care Teams include your primary Cardiologist (physician) and Advanced Practice Providers (APPs -  Physician Assistants and Nurse Practitioners) who all work together to provide you with the care you need, when you need it.  We recommend signing up for the patient portal called "MyChart".  Sign up information is provided on this After Visit Summary.  MyChart is used to connect with patients for Virtual Visits (Telemedicine).  Patients are able to view lab/test results, encounter notes, upcoming appointments, etc.   Non-urgent messages can be sent to your provider as well.   To learn more about what you can do with MyChart, go to [email or web address].    Your next appointment:   6 week(s)  The format for your next appointment:   In Person  Provider:   Nicki Guadalajara, MD

## 2020-01-28 NOTE — Progress Notes (Signed)
Cardiology Office Note    Date:  01/28/2020   ID:  Shane Reese, DOB 1966/03/11, MRN [id number]  PCP:  Laurey Morale, MD  Cardiologist:  Shelva Majestic, MD   New cardiology evaluation for palpitations and nonexertional chest sensation  History of Present Illness:  Shane Reese is a 54 y.o. male who originally is from outside [place], [place].  He sees Dr. Alysia Penna for primary care recently was evaluated in the emergency room.  He presents for cardiology evaluation.  Shane Reese has a history of attention deficit disorder has been on adderall most recently 20 mg 3 times a day and he is now just started to try to wean himself down to 20 mg twice a day.  He has a history of chronic low back pain for which he is on oxycodone prescribed by Dr. Alysia Penna.  There is remote cocaine history but he has completely quit over 10 years ago and has no interest in resumption.  He had recently developed a "light tingle "sensation superficially on his left chest without description of chest pain.  This is nonexertional and at times can last for days and self resolves.  Over the past several weeks he also has noticed episodes where his heart would race and pound.  When since his heart rate is 126 bpm.  Another instance his heart rate had increased to 140 bpm.  He recently presented to the emergency room on 01/19/2020 for evaluation.  His ECG shows sinus rhythm with a ventricular rate at 80 bpm.  His blood pressure was 135/94.  Troponins were negative.  Laboratory was unremarkable.  He was recently started on metoprolol succinate in March initially at 50 mg but after 1 week of therapy since he was still having episodes of his heart racing dose was titrated to 100 mg daily which he is continue to take.  He states at times his blood pressure has been somewhat labile and 2 months ago had increased to 160/80-90 but other times it was 110/70.  His wife is a Designer, jewellery for hospice.  Following his ER evaluation  he underwent a 2D echo Doppler study which showed normal EF at 60 to 65% without regional wall motion abnormalities.  Left ventricular internal cavity size is mildly dilated.  There was grade 2 diastolic dysfunction.  Valves were normal.  A chest x-ray did not show any active cardiopulmonary disease.  Following his ER evaluation he now presents for cardiology evaluation.   Past Medical History:  Diagnosis Date  . ADHD (attention deficit hyperactivity disorder)   . Alcohol dependence ([place])    sees [employer].  Abused cocaine and narcotics in the past   . Depression   . Drug dependence   . ED (erectile dysfunction)   . Hypertension     Past Surgical History:  Procedure Laterality Date  . l lower leg fx surgery      Current Medications: Outpatient Medications Prior to Visit  Medication Sig Dispense Refill  . [START ON 03/06/2020] amphetamine-dextroamphetamine (ADDERALL) 20 MG tablet Take 1 tablet (20 mg total) by mouth 3 (three) times daily. 90 tablet 0  . aspirin EC 81 MG tablet Take 81 mg by mouth daily as needed for mild pain.    Marland Kitchen LORazepam (ATIVAN) 1 MG tablet TAKE ONE TABLET EVERY 6 HOURS AS NEEDED. (Patient taking differently: Take 1 mg by mouth every 6 (six) hours as needed for anxiety or sleep. ) 60 tablet 2  .  metoprolol succinate (TOPROL-XL) 100 MG 24 hr tablet Take 1 tablet (100 mg total) by mouth daily. Take with or immediately following a meal. 90 tablet 3  . omeprazole (PRILOSEC) 40 MG capsule Take 1 capsule (40 mg total) by mouth daily. 90 capsule 3  . ondansetron (ZOFRAN-ODT) 8 MG disintegrating tablet Take 8 mg by mouth every 8 (eight) hours as needed for nausea/vomiting.    Derrill Memo ON 02/18/2020] oxyCODONE (ROXICODONE) 15 MG immediate release tablet Take 1 tablet (15 mg total) by mouth every 4 (four) hours as needed for pain. 180 tablet 0  . tadalafil (CIALIS) 20 MG tablet TAKE 1 TABLET(20 MG) BY MOUTH DAILY AS NEEDED (Patient taking differently: Take 20 mg  by mouth daily as needed for erectile dysfunction. ) 30 tablet 5   No facility-administered medications prior to visit.     Allergies:   Patient has no known allergies.   Social History   Socioeconomic History  . Marital status: Married    Spouse name: Not on file  . Number of children: Not on file  . Years of education: Not on file  . Highest education level: Not on file  Occupational History  . Not on file  Tobacco Use  . Smoking status: Never Smoker  . Smokeless tobacco: Never Used  Substance and Sexual Activity  . Alcohol use: Yes    Alcohol/week: 0.0 standard drinks    Comment: occ  . Drug use: No  . Sexual activity: Not on file  Other Topics Concern  . Not on file  Social History Narrative   Occupation:   Married   Never Smoker   Alcohol use-yes   Drug use-no   Regular exercise-yes   Social Determinants of Radio broadcast assistant Strain:   . Difficulty of Paying Living Expenses:   Food Insecurity:   . Worried About Charity fundraiser in the Last Year:   . Arboriculturist in the Last Year:   Transportation Needs:   . Film/video editor (Medical):   Marland Kitchen Lack of Transportation (Non-Medical):   Physical Activity:   . Days of Exercise per Week:   . Minutes of Exercise per Session:   Stress:   . Feeling of Stress :   Social Connections:   . Frequency of Communication with Friends and Family:   . Frequency of Social Gatherings with Friends and Family:   . Attends Religious Services:   . Active Member of Clubs or Organizations:   . Attends Archivist Meetings:   Marland Kitchen Marital Status:     Is originally from outside [facility].  He is married for 24 years.  He has a BS degree from [employer].  He is in sales currently involved in real state.  He has 3 children ages 35, 2011.  Family History: Family history is notable that his mother is living at age 37.  Father died with lung disease at age 74.  He has 1 brother who is healthy at age 38.   ROS General: Negative; No fevers, chills, or night sweats;  HEENT: Negative; No changes in vision or hearing, sinus congestion, difficulty swallowing Pulmonary: Negative; No cough, wheezing, shortness of breath, hemoptysis Cardiovascular: See HPI GI: Negative; No nausea, vomiting, diarrhea, or abdominal pain GU: Negative; No dysuria, hematuria, or difficulty voiding Musculoskeletal: Chronic low back pain Hematologic/Oncology: Negative; no easy bruising, bleeding Endocrine: Negative; no heat/cold intolerance; no diabetes Neuro: Negative; no changes in balance, headaches Skin: Negative; No rashes  or skin lesions Psychiatric: Negative; No behavioral problems, depression Sleep: Negative; No snoring, daytime sleepiness, hypersomnolence, bruxism, restless legs, hypnogognic hallucinations, no cataplexy Other comprehensive 14 point system review is negative.   PHYSICAL EXAM:   VS:  BP 138/80 (BP Location: Left Arm, Patient Position: Sitting, Cuff Size: Large)   Pulse 65   Ht '6\' 2"'  (1.88 m)   Wt 220 lb (99.8 kg)   BMI 28.25 kg/m     Repeat blood pressure by me was 142/84.  Wt Readings from Last 3 Encounters:  01/28/20 220 lb (99.8 kg)  01/19/20 220 lb (99.8 kg)  12/01/19 222 lb 8 oz (100.9 kg)    General: Alert, oriented, no distress.  Skin: normal turgor, no rashes, warm and dry HEENT: Normocephalic, atraumatic. Pupils equal round and reactive to light; sclera anicteric; extraocular muscles intact;  Nose without nasal septal hypertrophy Mouth/Parynx benign; Mallinpatti scale 3 Neck: No JVD, no carotid bruits; normal carotid upstroke Lungs: clear to ausculatation and percussion; no wheezing or rales Chest wall: without tenderness to palpitation Heart: PMI not displaced, RRR, s1 s2 normal, trivial/6 systolic murmur, no diastolic murmur, no rubs, gallops, thrills, or heaves Abdomen: soft, nontender; no hepatosplenomehaly, BS+; abdominal aorta nontender and not dilated by palpation.  Back: no CVA tenderness Pulses 2+ Musculoskeletal: full range of motion, normal strength, no joint deformities Extremities: no clubbing cyanosis or edema, Homan's sign negative  Neurologic: grossly nonfocal; Cranial nerves grossly wnl Psychologic: Normal mood and affect   Studies/Labs Reviewed:   EKG:  EKG is ordered today.  ECG (independently read by me): Normal sinus rhythm at 65 bpm.  Borderline LVH by voltage criteria.  No ectopy.  Normal intervals.  Recent Labs: BMP Latest Ref Rng & Units 01/19/2020 12/01/2019 05/15/2018  Glucose 70 - 99 mg/dL 109(H) 118(H) 108(H)  BUN 6 - 20 mg/dL '17 9 13  ' Creatinine 0.61 - 1.24 mg/dL 1.10 1.10 1.03  Sodium 135 - 145 mmol/L 139 138 144  Potassium 3.5 - 5.1 mmol/L 4.4 3.9 4.1  Chloride 98 - 111 mmol/L 102 100 106  CO2 22 - 32 mmol/L '28 31 30  ' Calcium 8.9 - 10.3 mg/dL 9.3 9.5 9.4     Hepatic Function Latest Ref Rng & Units 12/01/2019 05/24/2012 06/25/2009  Total Protein 6.0 - 8.3 g/dL 6.7 6.8 6.9  Albumin 3.5 - 5.2 g/dL 4.4 4.0 4.7  AST 0 - 37 U/L 24 33 21  ALT 0 - 53 U/L 34 42 20  Alk Phosphatase 39 - 117 U/L 47 33(L) 47  Total Bilirubin 0.2 - 1.2 mg/dL 0.8 0.7 1.4(H)  Bilirubin, Direct 0.0 - 0.3 mg/dL 0.1 0.1 0.3    CBC Latest Ref Rng & Units 01/19/2020 12/01/2019 05/15/2018  WBC 4.0 - 10.5 K/uL 8.7 7.0 11.9(H)  Hemoglobin 13.0 - 17.0 g/dL 15.8 15.2 15.2  Hematocrit 39.0 - 52.0 % 48.2 45.4 46.3  Platelets 150 - 400 K/uL 208 203.0 263   Lab Results  Component Value Date   MCV 90.8 01/19/2020   MCV 91.6 12/01/2019   MCV 92.2 05/15/2018   Lab Results  Component Value Date   TSH 1.46 12/01/2019   No results found for: HGBA1C   BNP No results found for: BNP  ProBNP No results found for: PROBNP   Lipid Panel  No results found for: CHOL, TRIG, HDL, CHOLHDL, VLDL, LDLCALC, LDLDIRECT, LABVLDL   RADIOLOGY: DG Chest 2 View  Result Date: 01/19/2020 CLINICAL DATA:  Chest pain and shortness of breath x3 weeks. EXAM:  CHEST - 2 VIEW  COMPARISON:  May 15, 2018 FINDINGS: There is no evidence of acute infiltrate, pleural effusion or pneumothorax. The heart size and mediastinal contours are within normal limits. The visualized skeletal structures are unremarkable. IMPRESSION: No active cardiopulmonary disease. Electronically Signed   By: Virgina Norfolk M.D.   On: 01/19/2020 19:53   ECHOCARDIOGRAM COMPLETE  Result Date: 01/22/2020    ECHOCARDIOGRAM REPORT   Patient Name:   ZAEVION PARKE Date of Exam: 01/22/2020 Medical Rec #:  [id number]     Height:       74.0 in Accession #:    [id number]    Weight:       220.0 lb Date of Birth:  02-11-1966      BSA:          2.263 m Patient Age:    18 years      BP:           150/100 mmHg Patient Gender: M             HR:           79 bpm. Exam Location:  [facility] Procedure: 2D Echo, Cardiac Doppler, Color Doppler and Strain Analysis Indications:    R00.2 Palpitation.  History:        Patient has no prior history of Echocardiogram examinations.                 Risk Factors:Hypertension. ETOH.  Sonographer:    Jessee Avers, RDCS Referring Phys: [id number] STEPHEN A FRY IMPRESSIONS  1. Left ventricular ejection fraction, by estimation, is 60 to 65%. The left ventricle has normal function. The left ventricle has no regional wall motion abnormalities. The left ventricular internal cavity size was mildly dilated. Left ventricular diastolic parameters are consistent with Grade II diastolic dysfunction (pseudonormalization).  2. Right ventricular systolic function is normal. The right ventricular size is normal.  3. The mitral valve is normal in structure. Trivial mitral valve regurgitation. No evidence of mitral stenosis.  4. The aortic valve is tricuspid. Aortic valve regurgitation is not visualized. No aortic stenosis is present.  5. The inferior vena cava is normal in size with greater than 50% respiratory variability, suggesting right atrial pressure of 3 mmHg. FINDINGS  Left Ventricle: Left ventricular  ejection fraction, by estimation, is 60 to 65%. The left ventricle has normal function. The left ventricle has no regional wall motion abnormalities. The left ventricular internal cavity size was mildly dilated. There is  no left ventricular hypertrophy. Left ventricular diastolic parameters are consistent with Grade II diastolic dysfunction (pseudonormalization). Indeterminate filling pressures. Right Ventricle: The right ventricular size is normal. No increase in right ventricular wall thickness. Right ventricular systolic function is normal. Left Atrium: Left atrial size was normal in size. Right Atrium: Right atrial size was normal in size. Pericardium: Trivial pericardial effusion is present. Mitral Valve: The mitral valve is normal in structure. Normal mobility of the mitral valve leaflets. Trivial mitral valve regurgitation. No evidence of mitral valve stenosis. Tricuspid Valve: The tricuspid valve is normal in structure. Tricuspid valve regurgitation is not demonstrated. No evidence of tricuspid stenosis. Aortic Valve: The aortic valve is tricuspid. Aortic valve regurgitation is not visualized. No aortic stenosis is present. Pulmonic Valve: The pulmonic valve was normal in structure. Pulmonic valve regurgitation is not visualized. No evidence of pulmonic stenosis. Aorta: The aortic root is normal in size and structure. Venous: The inferior vena cava is normal in size with greater than  50% respiratory variability, suggesting right atrial pressure of 3 mmHg. IAS/Shunts: No atrial level shunt detected by color flow Doppler.  LEFT VENTRICLE PLAX 2D LVIDd:         5.80 cm  Diastology LVIDs:         3.70 cm  LV e' lateral:   5.62 cm/s LV PW:         0.70 cm  LV E/e' lateral: 10.2 LV IVS:        0.70 cm  LV e' medial:    5.18 cm/s LVOT diam:     2.20 cm  LV E/e' medial:  11.1 LV SV:         67 LV SV Index:   30       2D Longitudinal Strain LVOT Area:     3.80 cm 2D Strain GLS (A2C):   -18.5 %                          2D Strain GLS (A3C):   -16.5 %                         2D Strain GLS (A4C):   -16.8 %                         2D Strain GLS Avg:     -17.3 % RIGHT VENTRICLE RV Basal diam:  3.40 cm RV S prime:     14.50 cm/s TAPSE (M-mode): 3.0 cm LEFT ATRIUM             Index       RIGHT ATRIUM           Index LA diam:        3.40 cm 1.50 cm/m  RA Pressure: 3.00 mmHg LA Vol (A2C):   40.7 ml 17.98 ml/m RA Area:     13.20 cm LA Vol (A4C):   30.6 ml 13.52 ml/m RA Volume:   31.00 ml  13.70 ml/m LA Biplane Vol: 36.0 ml 15.91 ml/m  AORTIC VALVE LVOT Vmax:   112.00 cm/s LVOT Vmean:  66.300 cm/s LVOT VTI:    0.176 m  AORTA Ao Root diam: 3.50 cm Ao Asc diam:  3.30 cm MITRAL VALVE               TRICUSPID VALVE                            Estimated RAP:  3.00 mmHg  MV E velocity: 57.60 cm/s  SHUNTS MV A velocity: 55.50 cm/s  Systemic VTI:  0.18 m MV E/A ratio:  1.04        Systemic Diam: 2.20 cm Skeet Latch MD Electronically signed by Skeet Latch MD Signature Date/Time: 01/22/2020/6:18:30 PM    Final      Additional studies/ records that were reviewed today include:   I reviewed the records of Dr. Alysia Penna as well as the emergency room records.  ECHO 01/22/2020 IMPRESSIONS  1. Left ventricular ejection fraction, by estimation, is 60 to 65%. The  left ventricle has normal function. The left ventricle has no regional  wall motion abnormalities. The left ventricular internal cavity size was  mildly dilated. Left ventricular  diastolic parameters are consistent with Grade II diastolic dysfunction  (pseudonormalization).  2. Right ventricular systolic function is normal. The right ventricular  size  is normal.  3. The mitral valve is normal in structure. Trivial mitral valve  regurgitation. No evidence of mitral stenosis.  4. The aortic valve is tricuspid. Aortic valve regurgitation is not  visualized. No aortic stenosis is present.  5. The inferior vena cava is normal in size with greater than 50%   respiratory variability, suggesting right atrial pressure of 3 mmHg.   ASSESSMENT:    1. Atypical chest pain   2. Palpitations   3. Essential hypertension   4. Grade II diastolic dysfunction   5. Chronic low back pain with sciatica, sciatica laterality unspecified, unspecified back pain laterality   6. Attention deficit hyperactivity disorder (ADHD), unspecified ADHD type     PLAN:  Mr. Shedric Reese is a 54 year old gentleman who has a history of chronic low back pain for which he is on oxycodone provided by Dr. Sarajane Jews.  Patient denies any history of exertional chest pain and denies any classic anginal symptoms.  He has recently developed episodes of a tingle sensation in his left anterior chest unassociated with diaphoresis, shortness of breath, pain radiation.  I suspect this may be musculoskeletal rather than of ischemic etiology.  Over the past several months he also has noticed some blood pressure lability as well as episodes where his heart rate would increase to the 120s to 140 range.  He denied any abrupt onset and abrupt discontinuance of this tachycardia.  He has been treated most recently with metoprolol succinate titrated to 100 mg.  He is still experience some of his atypical chest sensation in addition to sensation of increased heart rate.  He did not take his metoprolol succinate for the past 2 days prior to this evaluation and his ECG today shows sinus rhythm at 65 but he undoubtedly still has metoprolol succinate on board's particularly with its longer half-life.  His blood pressure is elevated.  I reviewed with him new hypertensive guidelines and discussed increased cardiovascular risk associated with blood pressure elevation.  His echo Doppler study has demonstrated normal systolic function but there is grade 2 diastolic dysfunction.  I have suggested the addition of ARB therapy will initiate losartan and give him a 50 mg pill.  I recommended for the first week initiate 25 mg daily and  after 1 week as long as his blood pressure continues to be low 130s or above increase this to 50 mg daily.  With his sensation of palpitations I am recommending him wear a Zio patch for 14 days.  I discussed with him his treatment for his ADHD with amphetamine and dextroamphetamine may be contributing to his tachycardic episodes.  He is now reducing his dose to 20 mg twice daily and have suggested that potentially this can be further weaned to lower dosing and potentially ultimately discontinued if necessary.  He has a remote history of cocaine use for 10 years duration but has not used any in over 10 years and is adamant that he will never go down that path again.  He will monitor his blood pressure and heart rate.  I will see him in 6 weeks.  Cardiology reevaluation and further recommendations were made at that time.   Medication Adjustments/Labs and Tests Ordered: Current medicines are reviewed at length with the patient today.  Concerns regarding medicines are outlined above.  Medication changes, Labs and Tests ordered today are listed in the Patient Instructions below. Patient Instructions  Medication Instructions:  BEGIN TAKING LOSARTAN -- FOR THE FIRST WEEK TAKE 1/2 TABLET (25MG) IF  BP IS CONSISTENTLY GREATER THANK 135 YOU CAN INCREASE TO 1 WHOLE TABLET OR (50MG)  *If you need a refill on your cardiac medications before your next appointment, please call your pharmacy*   Testing/Procedures: ZIO XT- Long Term Monitor Instructions   Your physician has requested you wear your ZIO patch monitor__14_days.   This is a single patch monitor.  [employer] supplies one patch monitor per enrollment.  Additional stickers are not available.   Please do not apply patch if you will be having a Nuclear Stress Test, Echocardiogram, Cardiac CT, MRI, or Chest Xray during the time frame you would be wearing the monitor. The patch cannot be worn during these tests.  You cannot remove and re-apply the ZIO XT patch  monitor.   Your ZIO patch monitor will be sent USPS Priority mail from [facility] directly to your home address. The monitor may also be mailed to a PO BOX if home delivery is not available.   It may take 3-5 days to receive your monitor after you have been enrolled.   Once you have received you monitor, please review enclosed instructions.  Your monitor has already been registered assigning a specific monitor serial # to you.   Applying the monitor   Shave hair from upper left chest.   Hold abrader disc by orange tab.  Rub abrader in 40 strokes over left upper chest as indicated in your monitor instructions.   Clean area with 4 enclosed alcohol pads .  Use all pads to assure are is cleaned thoroughly.  Let dry.   Apply patch as indicated in monitor instructions.  Patch will be place under collarbone on left side of chest with arrow pointing upward.   Rub patch adhesive wings for 2 minutes.Remove white label marked "1".  Remove white label marked "2".  Rub patch adhesive wings for 2 additional minutes.   While looking in a mirror, press and release button in center of patch.  A small green light will flash 3-4 times .  This will be your only indicator the monitor has been turned on.     Do not shower for the first 24 hours.  You may shower after the first 24 hours.   Press button if you feel a symptom. You will hear a small click.  Record Date, Time and Symptom in the Patient Log Book.   When you are ready to remove patch, follow instructions on last 2 pages of Patient Log Book.  Stick patch monitor onto last page of Patient Log Book.   Place Patient Log Book in [place] box.  Use locking tab on box and tape box closed securely.  The [employer] and [employer] has [employer] on it.  Please place in mailbox as soon as possible.  Your physician should have your test results approximately 7 days after the monitor has been mailed back to [facility].   Call [facility]  at [phone] if you have questions regarding your ZIO XT patch monitor.  Call them immediately if you see an orange light blinking on your monitor.   If your monitor falls off in less than 4 days contact our Monitor department at [phone].  If your monitor becomes loose or falls off after 4 days call [employer] at [phone] for suggestions on securing your monitor.     Follow-Up: At [facility], you and your health needs are our priority.  As part of our continuing mission to provide you with exceptional heart care,  we have created designated Provider Care Teams.  These Care Teams include your primary Cardiologist (physician) and Advanced Practice Providers (APPs -  Physician Assistants and Nurse Practitioners) who all work together to provide you with the care you need, when you need it.  We recommend signing up for the patient portal called "MyChart".  Sign up information is provided on this After Visit Summary.  MyChart is used to connect with patients for Virtual Visits (Telemedicine).  Patients are able to view lab/test results, encounter notes, upcoming appointments, etc.  Non-urgent messages can be sent to your provider as well.   To learn more about what you can do with MyChart, go to [email or web address].    Your next appointment:   6 week(s)  The format for your next appointment:   In Person  Provider:   Shelva Majestic, MD     Signed, Shelva Majestic, MD  01/28/2020 4:55 PM    [facility] [address] [place], [place], [place]  [address] Phone: [phone]

## 2020-01-28 NOTE — Progress Notes (Signed)
Patient ID: Shane Reese, male   DOB: 1966/08/16, 54 y.o.   MRN: [id number] 14 day ZIO XT to be shipped to patients home.

## 2020-02-19 ENCOUNTER — Ambulatory Visit (INDEPENDENT_AMBULATORY_CARE_PROVIDER_SITE_OTHER): Payer: BC Managed Care – PPO

## 2020-02-19 DIAGNOSIS — R002 Palpitations: Secondary | ICD-10-CM

## 2020-02-19 DIAGNOSIS — R0789 Other chest pain: Secondary | ICD-10-CM

## 2020-02-19 DIAGNOSIS — R000 Tachycardia, unspecified: Secondary | ICD-10-CM | POA: Diagnosis not present

## 2020-03-12 ENCOUNTER — Other Ambulatory Visit: Payer: Self-pay | Admitting: Family Medicine

## 2020-03-17 ENCOUNTER — Encounter: Payer: Self-pay | Admitting: Family Medicine

## 2020-03-17 ENCOUNTER — Telehealth (INDEPENDENT_AMBULATORY_CARE_PROVIDER_SITE_OTHER): Payer: BC Managed Care – PPO | Admitting: Family Medicine

## 2020-03-17 VITALS — Ht 74.0 in | Wt 220.0 lb

## 2020-03-17 DIAGNOSIS — F1190 Opioid use, unspecified, uncomplicated: Secondary | ICD-10-CM

## 2020-03-17 DIAGNOSIS — G8929 Other chronic pain: Secondary | ICD-10-CM

## 2020-03-17 DIAGNOSIS — M5440 Lumbago with sciatica, unspecified side: Secondary | ICD-10-CM | POA: Diagnosis not present

## 2020-03-17 MED ORDER — OXYCODONE HCL 15 MG PO TABS: 15.0000 mg | ORAL_TABLET | ORAL | 0 refills | Status: DC | PRN

## 2020-03-17 NOTE — Progress Notes (Signed)
Subjective:    Patient ID: Shane Reese, male    DOB: 03-14-1966, 54 y.o.   MRN: [id number]  HPI Virtual Visit via Video Note  I connected with the patient on 03/17/20 at  3:45 PM EDT by a video enabled telemedicine application and verified that I am speaking with the correct person using two identifiers.  Location patient: home Location provider:work or home office Persons participating in the virtual visit: patient, provider  I discussed the limitations of evaluation and management by telemedicine and the availability of in person appointments. The patient expressed understanding and agreed to proceed.   HPI: Her for pain management, he is doing well.  Indication for chronic opioid: low back pain Medication and dose: Oxycodone 15 mg  # pills per month: 180 Last UDS date: 12-01-19 Opioid Treatment Agreement signed (Y/N): 11-25-18 Opioid Treatment Agreement last reviewed with patient:  03-17-20 NCCSRS reviewed this encounter (include red flags): Yes    ROS: See pertinent positives and negatives per HPI.  Past Medical History:  Diagnosis Date  . ADHD (attention deficit hyperactivity disorder)   . Alcohol dependence (HCC)    sees [facility].  Abused cocaine and narcotics in the past   . Depression   . Drug dependence   . ED (erectile dysfunction)   . Hypertension     Past Surgical History:  Procedure Laterality Date  . l lower leg fx surgery      History reviewed. No pertinent family history.   Current Outpatient Medications:  .  amphetamine-dextroamphetamine (ADDERALL) 20 MG tablet, Take 1 tablet (20 mg total) by mouth 3 (three) times daily., Disp: 90 tablet, Rfl: 0 .  aspirin EC 81 MG tablet, Take 81 mg by mouth daily as needed for mild pain., Disp: , Rfl:  .  LORazepam (ATIVAN) 1 MG tablet, TAKE ONE TABLET EVERY 6 HOURS AS NEEDED. (Patient taking differently: Take 1 mg by mouth every 6 (six) hours as needed for anxiety or sleep. ), Disp: 60 tablet,  Rfl: 2 .  losartan (COZAAR) 50 MG tablet, FOR THE FIRST WEEK TAKE 25MG  (1/2 TAB) IF BP IS CONSISTENTLY GREATER THAN 135, MAY INCREASE TO WHOLE TABLET (50MG ), Disp: 90 tablet, Rfl: 3 .  metoprolol succinate (TOPROL-XL) 100 MG 24 hr tablet, Take 1 tablet (100 mg total) by mouth daily. Take with or immediately following a meal., Disp: 90 tablet, Rfl: 3 .  omeprazole (PRILOSEC) 40 MG capsule, Take 1 capsule (40 mg total) by mouth daily., Disp: 90 capsule, Rfl: 3 .  ondansetron (ZOFRAN-ODT) 8 MG disintegrating tablet, Take 8 mg by mouth every 8 (eight) hours as needed for nausea/vomiting., Disp: , Rfl:  .  [START ON 05/19/2020] oxyCODONE (ROXICODONE) 15 MG immediate release tablet, Take 1 tablet (15 mg total) by mouth every 4 (four) hours as needed for pain., Disp: 180 tablet, Rfl: 0 .  tadalafil (CIALIS) 20 MG tablet, TAKE ONE TABLET BY MOUTH DAILY AS NEEDED FOR ERECTILE DYSFUNCTION, Disp: 30 tablet, Rfl: 4  EXAM:  VITALS per patient if applicable:  GENERAL: alert, oriented, appears well and in no acute distress  HEENT: atraumatic, conjunttiva clear, no obvious abnormalities on inspection of external nose and ears  NECK: normal movements of the head and neck  LUNGS: on inspection no signs of respiratory distress, breathing rate appears normal, no obvious gross SOB, gasping or wheezing  CV: no obvious cyanosis  MS: moves all visible extremities without noticeable abnormality  PSYCH/NEURO: pleasant and cooperative, no obvious depression or  anxiety, speech and thought processing grossly intact  ASSESSMENT AND PLAN: Pain management, meds were refilled.  Gershon Crane, MD  Discussed the following assessment and plan:  No diagnosis found.     I discussed the assessment and treatment plan with the patient. The patient was provided an opportunity to ask questions and all were answered. The patient agreed with the plan and demonstrated an understanding of the instructions.   The patient was  advised to call back or seek an in-person evaluation if the symptoms worsen or if the condition fails to improve as anticipated.     Review of Systems     Objective:   Physical Exam        Assessment & Plan:

## 2020-03-30 ENCOUNTER — Other Ambulatory Visit: Payer: Self-pay | Admitting: Family Medicine

## 2020-03-31 NOTE — Telephone Encounter (Signed)
Last filled 01/14/2019 Last OV 03/17/2020  Ok to fill?

## 2020-04-06 ENCOUNTER — Encounter: Payer: Self-pay | Admitting: Family Medicine

## 2020-04-07 MED ORDER — AMPHETAMINE-DEXTROAMPHETAMINE 20 MG PO TABS: 20.0000 mg | ORAL_TABLET | Freq: Three times a day (TID) | ORAL | 0 refills | Status: DC

## 2020-04-07 NOTE — Telephone Encounter (Signed)
Done

## 2020-04-08 ENCOUNTER — Ambulatory Visit (INDEPENDENT_AMBULATORY_CARE_PROVIDER_SITE_OTHER): Payer: BC Managed Care – PPO | Admitting: Cardiovascular Disease

## 2020-04-08 ENCOUNTER — Encounter: Payer: Self-pay | Admitting: Cardiovascular Disease

## 2020-04-08 ENCOUNTER — Other Ambulatory Visit: Payer: Self-pay

## 2020-04-08 VITALS — BP 140/78 | HR 80 | Ht 74.0 in | Wt 227.2 lb

## 2020-04-08 DIAGNOSIS — I10 Essential (primary) hypertension: Secondary | ICD-10-CM

## 2020-04-08 DIAGNOSIS — R002 Palpitations: Secondary | ICD-10-CM | POA: Diagnosis not present

## 2020-04-08 DIAGNOSIS — I5189 Other ill-defined heart diseases: Secondary | ICD-10-CM

## 2020-04-08 DIAGNOSIS — F909 Attention-deficit hyperactivity disorder, unspecified type: Secondary | ICD-10-CM

## 2020-04-08 DIAGNOSIS — R0789 Other chest pain: Secondary | ICD-10-CM | POA: Diagnosis not present

## 2020-04-08 DIAGNOSIS — I519 Heart disease, unspecified: Secondary | ICD-10-CM | POA: Diagnosis not present

## 2020-04-08 NOTE — Progress Notes (Signed)
Cardiology Office Note    Date:  04/12/2020   ID:  Shane Reese, DOB [birth year], MRN [id number]  PCP:  Laurey Morale, MD  Cardiologist:  Shelva Majestic, MD   F/U cardiology evaluation for palpitations and nonexertional chest sensation  History of Present Illness:  Shane Reese is a 54 y.o. male who originally is from outside [place], [place].  He sees Dr. Alysia Penna for primary care.  Following an emergency room evaluation for palpitations I saw him for initial evaluation in May 2021.  He presents for 53-monthfollow-up evaluation.  Mr. CIshmanhas a history of attention deficit disorder has been on adderall most recently 20 mg 3 times a day and he is now just started to try to wean himself down to 20 mg twice a day.  He has a history of chronic low back pain for which he is on oxycodone prescribed by Dr. SAlysia Penna  There is remote cocaine history but he has completely quit over 10 years ago and has no interest in resumption.  He had recently developed a "light tingle "sensation superficially on his left chest without description of chest pain.  This is nonexertional and at times can last for days and self resolves.  Over the past several weeks he also has noticed episodes where his heart would race and pound.  When since his heart rate is 126 bpm.  Another instance his heart rate had increased to 140 bpm.  He recently presented to the emergency room on 01/19/2020 for evaluation.  His ECG shows sinus rhythm with a ventricular rate at 80 bpm.  His blood pressure was 135/94.  Troponins were negative.  Laboratory was unremarkable.  He was recently started on metoprolol succinate in March initially at 50 mg but after 1 week of therapy since he was still having episodes of his heart racing dose was titrated to 100 mg daily which he is continue to take.  He states at times his blood pressure has been somewhat labile and 2 months ago had increased to 160/80-90 but other times it was 110/70.  His wife is  a nDesigner, jewelleryfor hospice.  Following his ER evaluation he underwent a 2D echo Doppler study which showed normal EF at 60 to 65% without regional wall motion abnormalities.  Left ventricular internal cavity size is mildly dilated.  There was grade 2 diastolic dysfunction.  Valves were normal.  A chest x-ray did not show any active cardiopulmonary disease.   I initially saw him following his ER evaluation he was hypertensive but had not taken metoprolol for 2 days prior to his evaluation.  I reviewed new hypertensive guidelines and discussed increased cardiovascular risk associated with blood pressure elevation.  I suggested the addition of ARB therapy with losartan 25 mg initially and after 1 week to titrate to 50 mg as long as blood pressure was greater than 130.  I also recommended he wear a 14-day Zio patch for further evaluation of his palpitations.  He was on therapy for ADHD and recommended reducing his dose of amphetamine-dextroamphetamine down to 20 mg twice a day and possibly ultimately discontinue if necessary.  His blood pressure did improve with initiation of losartan.  Apparently, he did obtain a Zio patch.  He wore the patch for several weeks.  However when he returned it, and apparently was missing the label and last week the patch was again returned to him and this still has not yet been analyzed.  He has  noticed improvement in his prior palpitations.  He presents for evaluation.  Past Medical History:  Diagnosis Date   Adenopathy, cervical 01/06/2014   ADHD (attention deficit hyperactivity disorder)    Alcohol dependence ([place])    sees [facility].  Abused cocaine and narcotics in the past    Anterior cervical adenopathy 12/22/2013   Attention deficit hyperactivity disorder (ADHD) 07/15/2007   Qualifier: Diagnosis of  By: Sarajane Jews MD, Ishmael Holter    Chronic low back pain 11/10/2015   Depression    Depression with anxiety 06/10/2007   Qualifier: Diagnosis of  By:  Rogue Bussing CMA, Jacqualynn     Drug dependence    ED (erectile dysfunction)    ERECTILE DYSFUNCTION, ORGANIC 06/25/2009   Qualifier: Diagnosis of  By: Sarajane Jews MD, Ishmael Holter    Hypertension     Past Surgical History:  Procedure Laterality Date   l lower leg fx surgery      Current Medications: Outpatient Medications Prior to Visit  Medication Sig Dispense Refill   [START ON 06/08/2020] amphetamine-dextroamphetamine (ADDERALL) 20 MG tablet Take 1 tablet (20 mg total) by mouth 3 (three) times daily. 90 tablet 0   aspirin EC 81 MG tablet Take 81 mg by mouth daily as needed for mild pain.     LORazepam (ATIVAN) 1 MG tablet TAKE 1 TABLET BY MOUTH EVERY 6 HOURS AS NEEDED 60 tablet 5   losartan (COZAAR) 50 MG tablet FOR THE FIRST WEEK TAKE 25MG (1/2 TAB) IF BP IS CONSISTENTLY GREATER THAN 135, MAY INCREASE TO WHOLE TABLET (50MG) 90 tablet 3   metoprolol succinate (TOPROL-XL) 100 MG 24 hr tablet Take 1 tablet (100 mg total) by mouth daily. Take with or immediately following a meal. 90 tablet 3   omeprazole (PRILOSEC) 40 MG capsule Take 1 capsule (40 mg total) by mouth daily. 90 capsule 3   ondansetron (ZOFRAN-ODT) 8 MG disintegrating tablet Take 8 mg by mouth every 8 (eight) hours as needed for nausea/vomiting.     [START ON 05/19/2020] oxyCODONE (ROXICODONE) 15 MG immediate release tablet Take 1 tablet (15 mg total) by mouth every 4 (four) hours as needed for pain. 180 tablet 0   tadalafil (CIALIS) 20 MG tablet TAKE ONE TABLET BY MOUTH DAILY AS NEEDED FOR ERECTILE DYSFUNCTION 30 tablet 4   No facility-administered medications prior to visit.     Allergies:   Patient has no known allergies.   Social History   Socioeconomic History   Marital status: Married    Spouse name: Not on file   Number of children: Not on file   Years of education: Not on file   Highest education level: Not on file  Occupational History   Not on file  Tobacco Use   Smoking status: Never Smoker    Smokeless tobacco: Never Used  Substance and Sexual Activity   Alcohol use: Yes    Alcohol/week: 0.0 standard drinks    Comment: occ   Drug use: No   Sexual activity: Not on file  Other Topics Concern   Not on file  Social History Narrative   Occupation:   Married   Never Smoker   Alcohol use-yes   Drug use-no   Regular exercise-yes   Social Determinants of Radio broadcast assistant Strain:    Difficulty of Paying Living Expenses:   Food Insecurity:    Worried About Charity fundraiser in the Last Year:    Arboriculturist in the Last Year:  Transportation Needs:    Film/video editor (Medical):    Lack of Transportation (Non-Medical):   Physical Activity:    Days of Exercise per Week:    Minutes of Exercise per Session:   Stress:    Feeling of Stress :   Social Connections:    Frequency of Communication with Friends and Family:    Frequency of Social Gatherings with Friends and Family:    Attends Religious Services:    Active Member of Clubs or Organizations:    Attends Archivist Meetings:    Marital Status:     Is originally from outside [place].  He is married for 24 years.  He has a BS degree from [employer].  He is in sales currently involved in real state.  He has 3 children ages 25, 2011.  Family History: Family history is notable that his mother is living at age 70.  Father died with lung disease at age 37.  He has 1 brother who is healthy at age 34.  ROS General: Negative; No fevers, chills, or night sweats;  HEENT: Negative; No changes in vision or hearing, sinus congestion, difficulty swallowing Pulmonary: Negative; No cough, wheezing, shortness of breath, hemoptysis Cardiovascular: See HPI GI: Negative; No nausea, vomiting, diarrhea, or abdominal pain GU: Negative; No dysuria, hematuria, or difficulty voiding Musculoskeletal: Chronic low back pain Hematologic/Oncology: Negative; no easy bruising,  bleeding Endocrine: Negative; no heat/cold intolerance; no diabetes Neuro: ADHD Skin: Negative; No rashes or skin lesions Psychiatric: Negative; No behavioral problems, depression Sleep: Negative; No snoring, daytime sleepiness, hypersomnolence, bruxism, restless legs, hypnogognic hallucinations, no cataplexy Other comprehensive 14 point system review is negative.   PHYSICAL EXAM:   VS:  BP (!) 140/78 (BP Location: Left Arm, Patient Position: Sitting, Cuff Size: Large)    Pulse 80    Ht _0  (1.88 m)    Wt (!) 227 lb 3.2 oz (103.1 kg)    SpO2 95%    BMI 29.17 kg/m     Blood pressure by me was now optimal at 120/68  Wt Readings from Last 3 Encounters:  04/08/20 (!) 227 lb 3.2 oz (103.1 kg)  03/17/20 220 lb (99.8 kg)  01/28/20 220 lb (99.8 kg)    General: Alert, oriented, no distress.  Skin: normal turgor, no rashes, warm and dry HEENT: Normocephalic, atraumatic. Pupils equal round and reactive to light; sclera anicteric; extraocular muscles intact;  Nose without nasal septal hypertrophy Mouth/Parynx benign; Mallinpatti scale 3 Neck: No JVD, no carotid bruits; normal carotid upstroke Lungs: clear to ausculatation and percussion; no wheezing or rales Chest wall: without tenderness to palpitation Heart: PMI not displaced, RRR, s1 s2 normal, 1/6 systolic murmur, no diastolic murmur, no rubs, gallops, thrills, or heaves Abdomen: soft, nontender; no hepatosplenomehaly, BS+; abdominal aorta nontender and not dilated by palpation. Back: no CVA tenderness Pulses 2+ Musculoskeletal: full range of motion, normal strength, no joint deformities Extremities: no clubbing cyanosis or edema, Homan's sign negative  Neurologic: grossly nonfocal; Cranial nerves grossly wnl Psychologic: Normal mood and affect   Studies/Labs Reviewed:   EKG:  EKG is ordered today.  ECG (independently read by me): Normal sinus rhythm at 65 bpm.  Borderline LVH by voltage criteria.  No ectopy.  Normal  intervals.  Recent Labs: BMP Latest Ref Rng & Units 01/19/2020 12/01/2019 05/15/2018  Glucose 70 - 99 mg/dL 109(H) 118(H) 108(H)  BUN 6 - 20 mg/dL _1 Creatinine 0.61 - 1.24 mg/dL 1.10 1.10 1.03  Sodium  135 - 145 mmol/L 139 138 144  Potassium 3.5 - 5.1 mmol/L 4.4 3.9 4.1  Chloride 98 - 111 mmol/L 102 100 106  CO2 22 - 32 mmol/L _0 Calcium 8.9 - 10.3 mg/dL 9.3 9.5 9.4     Hepatic Function Latest Ref Rng & Units 12/01/2019 05/24/2012 06/25/2009  Total Protein 6.0 - 8.3 g/dL 6.7 6.8 6.9  Albumin 3.5 - 5.2 g/dL 4.4 4.0 4.7  AST 0 - 37 U/L 24 33 21  ALT 0 - 53 U/L 34 42 20  Alk Phosphatase 39 - 117 U/L 47 33(L) 47  Total Bilirubin 0.2 - 1.2 mg/dL 0.8 0.7 1.4(H)  Bilirubin, Direct 0.0 - 0.3 mg/dL 0.1 0.1 0.3    CBC Latest Ref Rng & Units 01/19/2020 12/01/2019 05/15/2018  WBC 4.0 - 10.5 K/uL 8.7 7.0 11.9(H)  Hemoglobin 13.0 - 17.0 g/dL 15.8 15.2 15.2  Hematocrit 39 - 52 % 48.2 45.4 46.3  Platelets 150 - 400 K/uL 208 203.0 263   Lab Results  Component Value Date   MCV 90.8 01/19/2020   MCV 91.6 12/01/2019   MCV 92.2 05/15/2018   Lab Results  Component Value Date   TSH 1.46 12/01/2019   No results found for: HGBA1C   BNP No results found for: BNP  ProBNP No results found for: PROBNP   Lipid Panel  No results found for: CHOL, TRIG, HDL, CHOLHDL, VLDL, LDLCALC, LDLDIRECT, LABVLDL   RADIOLOGY: No results found.   Additional studies/ records that were reviewed today include:   I reviewed the records of Dr. Alysia Penna as well as the emergency room records.  ECHO 01/22/2020 IMPRESSIONS  1. Left ventricular ejection fraction, by estimation, is 60 to 65%. The  left ventricle has normal function. The left ventricle has no regional  wall motion abnormalities. The left ventricular internal cavity size was  mildly dilated. Left ventricular  diastolic parameters are consistent with Grade II diastolic dysfunction  (pseudonormalization).  2. Right ventricular  systolic function is normal. The right ventricular  size is normal.  3. The mitral valve is normal in structure. Trivial mitral valve  regurgitation. No evidence of mitral stenosis.  4. The aortic valve is tricuspid. Aortic valve regurgitation is not  visualized. No aortic stenosis is present.  5. The inferior vena cava is normal in size with greater than 50%  respiratory variability, suggesting right atrial pressure of 3 mmHg.   ASSESSMENT:    1. Palpitations   2. Essential hypertension   3. Atypical chest pain   4. Grade II diastolic dysfunction   5. Attention deficit hyperactivity disorder (ADHD), unspecified ADHD type     PLAN:  Mr. Laurence Crofford is a 54 year old gentleman who has a history of chronic low back pain for which he is on oxycodone provided by Dr. Sarajane Jews.  He denies any history of exertional chest pain and denies any classic anginal symptoms.  He had recently developed episodes of a tingle sensation in his left anterior chest unassociated with diaphoresis, shortness of breath, pain radiation.  I suspect this may be musculoskeletal rather than of ischemic etiology.  Over the past several months he also has noticed some blood pressure lability as well as episodes where his heart rate would increase to the 120s to 140 range.  He denied any abrupt onset and abrupt discontinuance of this tachycardia.  He has been treated most recently with metoprolol succinate titrated to 100 mg.  When I initially saw him, he was hypertensive and  had missed taking his metoprolol for several days.  His blood pressure was elevated and I added losartan initially at 25 mg with subsequent titration to 50 mg.  His blood pressure today on repeat by me is excellent at 120/68.  His resting pulse is now 80.  He apparently wore the Zio patch, mailed it back in, but apparently the label was missing and apparently it subsequently was returned back to him unprocessed.  I recommended he contact Tonye Pearson and send  the patch back for analysis.  He believes he is feeling better.  His weight is stable.  He will continue combination losartan and metoprolol succinate 100 mg daily for blood pressure control.  Ultimately if he can reduce the Aldoril dose that will be beneficial to reduce potential recurrent tachycardia dysrhythmias.  We will contact him regarding the Zio patch monitor results when they are available.  If he is stable I will see him in 1 year for follow-up evaluation.  However if repeat evaluation is necessary following the Zio patch analysis we will contact him to arrange for a follow-up visit.    Medication Adjustments/Labs and Tests Ordered: Current medicines are reviewed at length with the patient today.  Concerns regarding medicines are outlined above.  Medication changes, Labs and Tests ordered today are listed in the Patient Instructions below. Patient Instructions  Medication Instructions:  CONTINUE WITH CURRENT MEDICATIONS. NO CHANGES.  *If you need a refill on your cardiac medications before your next appointment, please call your pharmacy*  Follow-Up: At [facility], you and your health needs are our priority.  As part of our continuing mission to provide you with exceptional heart care, we have created designated Provider Care Teams.  These Care Teams include your primary Cardiologist (physician) and Advanced Practice Providers (APPs -  Physician Assistants and Nurse Practitioners) who all work together to provide you with the care you need, when you need it.  We recommend signing up for the patient portal called "MyChart".  Sign up information is provided on this After Visit Summary.  MyChart is used to connect with patients for Virtual Visits (Telemedicine).  Patients are able to view lab/test results, encounter notes, upcoming appointments, etc.  Non-urgent messages can be sent to your provider as well.   To learn more about what you can do with MyChart, go to [email or web address].     Your next appointment:   12 month(s)  The format for your next appointment:   In Person  Provider:   Shelva Majestic, MD        Signed, Shelva Majestic, MD  04/12/2020 7:02 PM    [facility] [address] [place], [place],   [address] Phone: [phone]

## 2020-04-08 NOTE — Patient Instructions (Signed)

## 2020-04-12 ENCOUNTER — Telehealth: Payer: Self-pay | Admitting: *Deleted

## 2020-04-12 ENCOUNTER — Encounter: Payer: Self-pay | Admitting: Cardiovascular Disease

## 2020-04-12 NOTE — Telephone Encounter (Signed)
Patients original shipping box for return of ZIO XT monitor has been damaged.  Instructed to call [employer] at [phone] and request a replacement shipping box be sent to his home.

## 2020-06-01 ENCOUNTER — Other Ambulatory Visit: Payer: Self-pay | Admitting: Cardiovascular Disease

## 2020-06-01 ENCOUNTER — Other Ambulatory Visit: Payer: Self-pay | Admitting: *Deleted

## 2020-06-01 DIAGNOSIS — R000 Tachycardia, unspecified: Secondary | ICD-10-CM

## 2020-06-01 DIAGNOSIS — R002 Palpitations: Secondary | ICD-10-CM

## 2020-06-01 DIAGNOSIS — R0789 Other chest pain: Secondary | ICD-10-CM

## 2020-06-01 DIAGNOSIS — R079 Chest pain, unspecified: Secondary | ICD-10-CM | POA: Diagnosis not present

## 2020-06-07 ENCOUNTER — Encounter: Payer: Self-pay | Admitting: Family Medicine

## 2020-06-10 ENCOUNTER — Encounter: Payer: Self-pay | Admitting: Family Medicine

## 2020-06-11 NOTE — Telephone Encounter (Signed)
He already has refills until 07-08-20

## 2020-06-17 ENCOUNTER — Encounter: Payer: Self-pay | Admitting: Family Medicine

## 2020-06-17 ENCOUNTER — Telehealth (INDEPENDENT_AMBULATORY_CARE_PROVIDER_SITE_OTHER): Payer: BC Managed Care – PPO | Admitting: Family Medicine

## 2020-06-17 DIAGNOSIS — F1190 Opioid use, unspecified, uncomplicated: Secondary | ICD-10-CM

## 2020-06-17 DIAGNOSIS — M5440 Lumbago with sciatica, unspecified side: Secondary | ICD-10-CM

## 2020-06-17 DIAGNOSIS — G8929 Other chronic pain: Secondary | ICD-10-CM

## 2020-06-17 MED ORDER — OXYCODONE HCL 15 MG PO TABS: 15.0000 mg | ORAL_TABLET | ORAL | 0 refills | Status: DC | PRN

## 2020-06-17 NOTE — Progress Notes (Signed)
   Subjective:    Patient ID: Shane Reese, male    DOB: [birth year], 54 y.o.   MRN: [id number]  HPI Here for pain management, he is doing well.  Indication for chronic opioid: low back pain Medication and dose: Oxycodone 15 mg  # pills per month: 180 Last UDS date: 12-01-19 Opioid Treatment Agreement signed (Y/N): 11-25-18 Opioid Treatment Agreement last reviewed with patient:  06-17-20 NCCSRS reviewed this encounter (include red flags): Yes    Review of Systems     Objective:   Physical Exam        Assessment & Plan:  Pain management, meds were refilled.  Gershon Crane, MD

## 2020-07-08 ENCOUNTER — Encounter: Payer: Self-pay | Admitting: Family Medicine

## 2020-07-12 MED ORDER — AMPHETAMINE-DEXTROAMPHETAMINE 20 MG PO TABS: 20.0000 mg | ORAL_TABLET | Freq: Three times a day (TID) | ORAL | 0 refills | Status: DC

## 2020-07-12 NOTE — Telephone Encounter (Signed)
Done

## 2020-07-20 ENCOUNTER — Telehealth (INDEPENDENT_AMBULATORY_CARE_PROVIDER_SITE_OTHER): Payer: Self-pay | Admitting: Family Medicine

## 2020-07-20 ENCOUNTER — Encounter: Payer: Self-pay | Admitting: Family Medicine

## 2020-07-20 VITALS — Ht 74.02 in | Wt 220.0 lb

## 2020-07-20 DIAGNOSIS — B349 Viral infection, unspecified: Secondary | ICD-10-CM

## 2020-07-20 DIAGNOSIS — T783XXA Angioneurotic edema, initial encounter: Secondary | ICD-10-CM

## 2020-07-20 DIAGNOSIS — I10 Essential (primary) hypertension: Secondary | ICD-10-CM

## 2020-07-20 NOTE — Progress Notes (Signed)
   Subjective:    Patient ID: Shane Reese, male    DOB: April 20, 1966, 54 y.o.   MRN: [id number]  HPI Virtual Visit via Telephone Note  I connected with the patient on 07/20/20 at  1:45 PM EST by telephone and verified that I am speaking with the correct person using two identifiers.   I discussed the limitations, risks, security and privacy concerns of performing an evaluation and management service by telephone and the availability of in person appointments. I also discussed with the patient that there may be a patient responsible charge related to this service. The patient expressed understanding and agreed to proceed.  Location patient: home Location provider: work or home office Participants present for the call: patient, provider Patient did not have a visit in the prior 7 days to address this/these issue(s).   History of Present Illness: Here with his wife (who is a NP) for 24 hours of low grade fever around 99, sweats, and fatigue. Then he woke up this morning with swelling all around his face. No tongue swelling or SOB. His BP has been stable, and just before this call it was 140/80. No body aches or NVD. No cough or ST. He has been drinking lots of fluids. His wife this he is having angioedema from the Losartan (which he started taking in May).    Observations/Objective: Patient sounds cheerful and well on the phone. I do not appreciate any SOB. Speech and thought processing are grossly intact. Patient reported vitals:  Assessment and Plan: It does sound like he has angioedema from the Losartan, so we will stop this immediately. He wiill keep drinking fluids and he can add Benadryl every 4 hours as needed. For the HTN, they will follow the BP closely and will let us know if it starts to climb. He also seems to have a viral illness of some sort. They plan to get him tested for the Covid-19 virus today.  Gershon Crane, MD   Follow Up Instructions:     [phone] 5-10 [phone]  11-20 [id number] 21-30 I did not refer this patient for an OV in the next 24 hours for this/these issue(s).  I discussed the assessment and treatment plan with the patient. The patient was provided an opportunity to ask questions and all were answered. The patient agreed with the plan and demonstrated an understanding of the instructions.   The patient was advised to call back or seek an in-person evaluation if the symptoms worsen or if the condition fails to improve as anticipated.  I provided 16 minutes of non-face-to-face time during this encounter.   Gershon Crane, MD    Review of Systems     Objective:   Physical Exam        Assessment & Plan:

## 2020-09-14 ENCOUNTER — Encounter: Payer: Self-pay | Admitting: Family Medicine

## 2020-09-16 MED ORDER — OXYCODONE HCL 15 MG PO TABS: 15.0000 mg | ORAL_TABLET | ORAL | 0 refills | Status: AC | PRN

## 2020-09-16 NOTE — Telephone Encounter (Signed)
I sent in a one month refill on Oxycodone. He already has a refill available for Adderall as of 09-11-20

## 2020-10-06 ENCOUNTER — Other Ambulatory Visit: Payer: Self-pay | Admitting: Family Medicine

## 2020-10-18 ENCOUNTER — Encounter: Payer: Self-pay | Admitting: Family Medicine

## 2020-10-18 MED ORDER — AMPHETAMINE-DEXTROAMPHETAMINE 20 MG PO TABS: 20.0000 mg | ORAL_TABLET | Freq: Three times a day (TID) | ORAL | 0 refills | Status: DC

## 2020-10-18 NOTE — Telephone Encounter (Signed)
I refilled the Adderall but I cannot refill the Oxycodone with out a PMV. We cannot do this on the 15th anyway because this requires its own visit (not as part of a physical). He can have a virtual PMV this week

## 2020-10-19 ENCOUNTER — Telehealth (INDEPENDENT_AMBULATORY_CARE_PROVIDER_SITE_OTHER): Payer: Self-pay | Admitting: Family Medicine

## 2020-10-19 ENCOUNTER — Encounter: Payer: Self-pay | Admitting: Family Medicine

## 2020-10-19 DIAGNOSIS — M5440 Lumbago with sciatica, unspecified side: Secondary | ICD-10-CM

## 2020-10-19 DIAGNOSIS — G8929 Other chronic pain: Secondary | ICD-10-CM

## 2020-10-19 DIAGNOSIS — F1190 Opioid use, unspecified, uncomplicated: Secondary | ICD-10-CM

## 2020-10-19 MED ORDER — OXYCODONE HCL 15 MG PO TABS
15.0000 mg | ORAL_TABLET | ORAL | 0 refills | Status: DC | PRN
Start: 2020-10-19 — End: 2020-10-19

## 2020-10-19 MED ORDER — OXYCODONE HCL 15 MG PO TABS: 15.0000 mg | ORAL_TABLET | ORAL | 0 refills | Status: DC | PRN

## 2020-10-19 NOTE — Progress Notes (Signed)
   Subjective:    Patient ID: Shane Reese, male    DOB: Feb 23, 1966, 55 y.o.   MRN: [id number]  HPI Virtual Visit via Telephone Note  I connected with the patient on 10/19/20 at  3:00 PM EST by telephone and verified that I am speaking with the correct person using two identifiers.   I discussed the limitations, risks, security and privacy concerns of performing an evaluation and management service by telephone and the availability of in person appointments. I also discussed with the patient that there may be a patient responsible charge related to this service. The patient expressed understanding and agreed to proceed.  Location patient: home Location provider: work or home office Participants present for the call: patient, provider Patient did not have a visit in the prior 7 days to address this/these issue(s).   History of Present Illness: Here for pain management, he is doing well.  Indication for chronic opioid: back pain Medication and dose: Oxycodone 15 mg # pills per month: 180 Last UDS date: 12-01-19 Opioid Treatment Agreement signed (Y/N): 11-25-18 Opioid Treatment Agreement last reviewed with patient:  10-19-20 N[facility] reviewed this encounter (include red flags): Yes    Observations/Objective: Patient sounds cheerful and well on the phone. I do not appreciate any SOB. Speech and thought processing are grossly intact. Patient reported vitals:  Assessment and Plan: Pain management, meds were refilled.  Gershon Crane, MD   Follow Up Instructions:     [phone] 5-10 [phone] 11-20 [id number] 21-30 I did not refer this patient for an OV in the next 24 hours for this/these issue(s).  I discussed the assessment and treatment plan with the patient. The patient was provided an opportunity to ask questions and all were answered. The patient agreed with the plan and demonstrated an understanding of the instructions.   The patient was advised to call back or seek an in-person evaluation  if the symptoms worsen or if the condition fails to improve as anticipated.  I provided 11 minutes of non-face-to-face time during this encounter.   Gershon Crane, MD    Review of Systems     Objective:   Physical Exam        Assessment & Plan:

## 2020-10-26 ENCOUNTER — Encounter: Payer: Self-pay | Admitting: Family Medicine

## 2020-10-26 DIAGNOSIS — Z0289 Encounter for other administrative examinations: Secondary | ICD-10-CM

## 2021-01-13 ENCOUNTER — Other Ambulatory Visit: Payer: Self-pay

## 2021-01-14 ENCOUNTER — Telehealth (INDEPENDENT_AMBULATORY_CARE_PROVIDER_SITE_OTHER): Payer: Self-pay | Admitting: Family Medicine

## 2021-01-14 ENCOUNTER — Encounter: Payer: Self-pay | Admitting: Family Medicine

## 2021-01-14 DIAGNOSIS — M5440 Lumbago with sciatica, unspecified side: Secondary | ICD-10-CM

## 2021-01-14 DIAGNOSIS — G8929 Other chronic pain: Secondary | ICD-10-CM

## 2021-01-14 DIAGNOSIS — F1190 Opioid use, unspecified, uncomplicated: Secondary | ICD-10-CM

## 2021-01-14 MED ORDER — OXYCODONE HCL 15 MG PO TABS: 15.0000 mg | ORAL_TABLET | ORAL | 0 refills | Status: DC | PRN

## 2021-01-14 MED ORDER — AMPHETAMINE-DEXTROAMPHETAMINE 20 MG PO TABS: 20.0000 mg | ORAL_TABLET | Freq: Three times a day (TID) | ORAL | 0 refills | Status: DC

## 2021-01-14 MED ORDER — OXYCODONE HCL 15 MG PO TABS: 15.0000 mg | ORAL_TABLET | ORAL | 0 refills | Status: AC | PRN

## 2021-01-14 MED ORDER — AMPHETAMINE-DEXTROAMPHETAMINE 20 MG PO TABS
20.0000 mg | ORAL_TABLET | Freq: Three times a day (TID) | ORAL | 0 refills | Status: DC
Start: 2021-01-15 — End: 2021-01-14

## 2021-01-14 NOTE — Progress Notes (Signed)
Subjective:    Patient ID: Shane Reese, male    DOB: April 19, 1966, 55 y.o.   MRN: [id number]  HPI Virtual Visit via Video Note  I connected with the patient on 01/14/21 at  2:30 PM EDT by a video enabled telemedicine application and verified that I am speaking with the correct person using two identifiers.  Location patient: home Location provider:work or home office Persons participating in the virtual visit: patient, provider  I discussed the limitations of evaluation and management by telemedicine and the availability of in person appointments. The patient expressed understanding and agreed to proceed.   HPI: Here for pain management, he is doing well.    ROS: See pertinent positives and negatives per HPI.  Past Medical History:  Diagnosis Date  . Adenopathy, cervical 01/06/2014  . ADHD (attention deficit hyperactivity disorder)   . Alcohol dependence (HCC)    sees [employer].  Abused cocaine and narcotics in the past   . Anterior cervical adenopathy 12/22/2013  . Attention deficit hyperactivity disorder (ADHD) 07/15/2007   Qualifier: Diagnosis of  By: Clent Ridges MD, Tera Mater   . Chronic low back pain 11/10/2015  . Depression   . Depression with anxiety 06/10/2007   Qualifier: Diagnosis of  By: Claiborne Billings CMA, Terance Ice    . Drug dependence   . ED (erectile dysfunction)   . ERECTILE DYSFUNCTION, ORGANIC 06/25/2009   Qualifier: Diagnosis of  By: Clent Ridges MD, Tera Mater   . Hypertension     Past Surgical History:  Procedure Laterality Date  . l lower leg fx surgery      History reviewed. No pertinent family history.   Current Outpatient Medications:  .  aspirin EC 81 MG tablet, Take 81 mg by mouth daily as needed for mild pain., Disp: , Rfl:  .  cloNIDine (CATAPRES) 0.1 MG tablet, , Disp: , Rfl:  .  LORazepam (ATIVAN) 1 MG tablet, TAKE 1 TABLET BY MOUTH EVERY 6 HOURS AS NEEDED, Disp: 60 tablet, Rfl: 5 .  metoprolol succinate (TOPROL-XL) 100 MG 24 hr tablet, Take 1  tablet (100 mg total) by mouth daily. Take with or immediately following a meal., Disp: 90 tablet, Rfl: 3 .  omeprazole (PRILOSEC) 40 MG capsule, TAKE 1 CAPSULE(40 MG) BY MOUTH DAILY, Disp: 90 capsule, Rfl: 3 .  ondansetron (ZOFRAN-ODT) 8 MG disintegrating tablet, Take 8 mg by mouth every 8 (eight) hours as needed for nausea/vomiting., Disp: , Rfl:  .  tadalafil (CIALIS) 20 MG tablet, TAKE ONE TABLET BY MOUTH DAILY AS NEEDED FOR ERECTILE DYSFUNCTION, Disp: 30 tablet, Rfl: 4 .  [START ON 03/17/2021] amphetamine-dextroamphetamine (ADDERALL) 20 MG tablet, Take 1 tablet (20 mg total) by mouth 3 (three) times daily., Disp: 90 tablet, Rfl: 0 .  [START ON 03/17/2021] oxyCODONE (ROXICODONE) 15 MG immediate release tablet, Take 1 tablet (15 mg total) by mouth every 4 (four) hours as needed for pain., Disp: 180 tablet, Rfl: 0  EXAM:  VITALS per patient if applicable:  GENERAL: alert, oriented, appears well and in no acute distress  HEENT: atraumatic, conjunttiva clear, no obvious abnormalities on inspection of external nose and ears  NECK: normal movements of the head and neck  LUNGS: on inspection no signs of respiratory distress, breathing rate appears normal, no obvious gross SOB, gasping or wheezing  CV: no obvious cyanosis  MS: moves all visible extremities without noticeable abnormality  PSYCH/NEURO: pleasant and cooperative, no obvious depression or anxiety, speech and thought processing grossly intact  ASSESSMENT AND  PLAN: Pain  Management.  Indication for chronic opioid: low back pain Medication and dose: Oxycodone 15 mg # pills per month: 180 Last UDS date: 12-01-19 Opioid Treatment Agreement signed (Y/N): 11-25-18 Opioid Treatment Agreement last reviewed with patient:  01-14-21 NCCSRS reviewed this encounter (include red flags): Yes Meds were refilled. Gershon Crane, MD  Discussed the following assessment and plan:  No diagnosis found.     I discussed the assessment and treatment  plan with the patient. The patient was provided an opportunity to ask questions and all were answered. The patient agreed with the plan and demonstrated an understanding of the instructions.   The patient was advised to call back or seek an in-person evaluation if the symptoms worsen or if the condition fails to improve as anticipated.     Review of Systems     Objective:   Physical Exam        Assessment & Plan:

## 2021-02-11 ENCOUNTER — Other Ambulatory Visit: Payer: Self-pay | Admitting: Family Medicine

## 2021-03-17 ENCOUNTER — Other Ambulatory Visit: Payer: Self-pay | Admitting: Family Medicine

## 2021-03-18 ENCOUNTER — Encounter: Payer: Self-pay | Admitting: Family Medicine

## 2021-03-18 NOTE — Telephone Encounter (Signed)
Video visit- 01/14/21  Please see message from patient within refill request

## 2021-03-21 MED ORDER — AMPHETAMINE-DEXTROAMPHETAMINE 20 MG PO TABS: 20.0000 mg | ORAL_TABLET | Freq: Three times a day (TID) | ORAL | 0 refills | Status: DC

## 2021-03-21 NOTE — Telephone Encounter (Signed)
I sent one month of Adderall to [facility] on [address]

## 2021-04-21 ENCOUNTER — Other Ambulatory Visit: Payer: Self-pay

## 2021-04-21 DIAGNOSIS — F1190 Opioid use, unspecified, uncomplicated: Secondary | ICD-10-CM

## 2021-04-21 NOTE — Addendum Note (Signed)
Addended by: Kandra Nicolas on: 04/21/2021 01:22 PM   Modules accepted: Orders

## 2021-04-25 LAB — DRUG MONITOR, PANEL 1, W/CONF, URINE
Amphetamine: 6151 ng/mL — ABNORMAL HIGH (ref <250)
Amphetamines: POSITIVE ng/mL — AB (ref <500)
Barbiturates: NEGATIVE ng/mL (ref <300)
Benzodiazepines: NEGATIVE ng/mL (ref <100)
Cocaine Metabolite: NEGATIVE ng/mL (ref <150)
Codeine: NEGATIVE ng/mL (ref <50)
Creatinine: 253.2 mg/dL (ref >=20.0)
Hydrocodone: NEGATIVE ng/mL (ref <50)
Hydromorphone: NEGATIVE ng/mL (ref <50)
Marijuana Metabolite: NEGATIVE ng/mL (ref <20)
Methadone Metabolite: NEGATIVE ng/mL (ref <100)
Methamphetamine: NEGATIVE ng/mL (ref <250)
Morphine: NEGATIVE ng/mL (ref <50)
Norhydrocodone: 58 ng/mL — ABNORMAL HIGH (ref <50)
Noroxycodone: >10000 ng/mL — ABNORMAL HIGH (ref <50)
Opiates: POSITIVE ng/mL — AB (ref <100)
Oxidant: NEGATIVE ug/mL (ref <200)
Oxycodone: >10000 ng/mL — ABNORMAL HIGH (ref <50)
Oxycodone: POSITIVE ng/mL — AB (ref <100)
Oxymorphone: >10000 ng/mL — ABNORMAL HIGH (ref <50)
Phencyclidine: NEGATIVE ng/mL (ref <25)
pH: 6.2 (ref 4.5–9.0)

## 2021-04-25 LAB — DM TEMPLATE

## 2021-04-26 DIAGNOSIS — F1120 Opioid dependence, uncomplicated: Secondary | ICD-10-CM | POA: Diagnosis not present

## 2021-04-26 DIAGNOSIS — E860 Dehydration: Secondary | ICD-10-CM | POA: Diagnosis not present

## 2021-04-26 DIAGNOSIS — R112 Nausea with vomiting, unspecified: Secondary | ICD-10-CM | POA: Diagnosis not present

## 2021-04-26 DIAGNOSIS — K2970 Gastritis, unspecified, without bleeding: Secondary | ICD-10-CM | POA: Diagnosis not present

## 2021-04-26 DIAGNOSIS — K219 Gastro-esophageal reflux disease without esophagitis: Secondary | ICD-10-CM | POA: Diagnosis not present

## 2021-04-26 DIAGNOSIS — I10 Essential (primary) hypertension: Secondary | ICD-10-CM | POA: Diagnosis not present

## 2021-04-26 DIAGNOSIS — K529 Noninfective gastroenteritis and colitis, unspecified: Secondary | ICD-10-CM | POA: Diagnosis not present

## 2021-04-26 DIAGNOSIS — G4700 Insomnia, unspecified: Secondary | ICD-10-CM | POA: Diagnosis not present

## 2021-04-27 DIAGNOSIS — F1120 Opioid dependence, uncomplicated: Secondary | ICD-10-CM | POA: Diagnosis not present

## 2021-04-27 DIAGNOSIS — G4700 Insomnia, unspecified: Secondary | ICD-10-CM | POA: Diagnosis not present

## 2021-04-27 DIAGNOSIS — I10 Essential (primary) hypertension: Secondary | ICD-10-CM | POA: Diagnosis not present

## 2021-04-27 DIAGNOSIS — K219 Gastro-esophageal reflux disease without esophagitis: Secondary | ICD-10-CM | POA: Diagnosis not present

## 2021-04-28 DIAGNOSIS — I10 Essential (primary) hypertension: Secondary | ICD-10-CM | POA: Diagnosis not present

## 2021-04-28 DIAGNOSIS — F1120 Opioid dependence, uncomplicated: Secondary | ICD-10-CM | POA: Diagnosis not present

## 2021-04-28 DIAGNOSIS — K219 Gastro-esophageal reflux disease without esophagitis: Secondary | ICD-10-CM | POA: Diagnosis not present

## 2021-04-28 DIAGNOSIS — G4700 Insomnia, unspecified: Secondary | ICD-10-CM | POA: Diagnosis not present

## 2021-04-29 DIAGNOSIS — K219 Gastro-esophageal reflux disease without esophagitis: Secondary | ICD-10-CM | POA: Diagnosis not present

## 2021-04-29 DIAGNOSIS — F1120 Opioid dependence, uncomplicated: Secondary | ICD-10-CM | POA: Diagnosis not present

## 2021-04-29 DIAGNOSIS — I10 Essential (primary) hypertension: Secondary | ICD-10-CM | POA: Diagnosis not present

## 2021-04-29 DIAGNOSIS — G4700 Insomnia, unspecified: Secondary | ICD-10-CM | POA: Diagnosis not present

## 2021-04-30 DIAGNOSIS — G4700 Insomnia, unspecified: Secondary | ICD-10-CM | POA: Diagnosis not present

## 2021-04-30 DIAGNOSIS — F1120 Opioid dependence, uncomplicated: Secondary | ICD-10-CM | POA: Diagnosis not present

## 2021-04-30 DIAGNOSIS — K219 Gastro-esophageal reflux disease without esophagitis: Secondary | ICD-10-CM | POA: Diagnosis not present

## 2021-04-30 DIAGNOSIS — I10 Essential (primary) hypertension: Secondary | ICD-10-CM | POA: Diagnosis not present

## 2021-05-01 DIAGNOSIS — K219 Gastro-esophageal reflux disease without esophagitis: Secondary | ICD-10-CM | POA: Diagnosis not present

## 2021-05-01 DIAGNOSIS — G4700 Insomnia, unspecified: Secondary | ICD-10-CM | POA: Diagnosis not present

## 2021-05-01 DIAGNOSIS — F1120 Opioid dependence, uncomplicated: Secondary | ICD-10-CM | POA: Diagnosis not present

## 2021-05-01 DIAGNOSIS — I10 Essential (primary) hypertension: Secondary | ICD-10-CM | POA: Diagnosis not present

## 2021-05-02 DIAGNOSIS — G4700 Insomnia, unspecified: Secondary | ICD-10-CM | POA: Diagnosis not present

## 2021-05-02 DIAGNOSIS — F1120 Opioid dependence, uncomplicated: Secondary | ICD-10-CM | POA: Diagnosis not present

## 2021-05-02 DIAGNOSIS — I10 Essential (primary) hypertension: Secondary | ICD-10-CM | POA: Diagnosis not present

## 2021-05-02 DIAGNOSIS — K219 Gastro-esophageal reflux disease without esophagitis: Secondary | ICD-10-CM | POA: Diagnosis not present

## 2021-05-03 DIAGNOSIS — I10 Essential (primary) hypertension: Secondary | ICD-10-CM | POA: Diagnosis not present

## 2021-05-03 DIAGNOSIS — F1120 Opioid dependence, uncomplicated: Secondary | ICD-10-CM | POA: Diagnosis not present

## 2021-05-03 DIAGNOSIS — K219 Gastro-esophageal reflux disease without esophagitis: Secondary | ICD-10-CM | POA: Diagnosis not present

## 2021-05-03 DIAGNOSIS — G4700 Insomnia, unspecified: Secondary | ICD-10-CM | POA: Diagnosis not present

## 2021-05-04 DIAGNOSIS — G4700 Insomnia, unspecified: Secondary | ICD-10-CM | POA: Diagnosis not present

## 2021-05-04 DIAGNOSIS — I10 Essential (primary) hypertension: Secondary | ICD-10-CM | POA: Diagnosis not present

## 2021-05-04 DIAGNOSIS — F1120 Opioid dependence, uncomplicated: Secondary | ICD-10-CM | POA: Diagnosis not present

## 2021-05-04 DIAGNOSIS — K219 Gastro-esophageal reflux disease without esophagitis: Secondary | ICD-10-CM | POA: Diagnosis not present

## 2021-05-05 DIAGNOSIS — I10 Essential (primary) hypertension: Secondary | ICD-10-CM | POA: Diagnosis not present

## 2021-05-05 DIAGNOSIS — F1120 Opioid dependence, uncomplicated: Secondary | ICD-10-CM | POA: Diagnosis not present

## 2021-05-05 DIAGNOSIS — K219 Gastro-esophageal reflux disease without esophagitis: Secondary | ICD-10-CM | POA: Diagnosis not present

## 2021-05-05 DIAGNOSIS — G4700 Insomnia, unspecified: Secondary | ICD-10-CM | POA: Diagnosis not present

## 2021-05-06 DIAGNOSIS — G4700 Insomnia, unspecified: Secondary | ICD-10-CM | POA: Diagnosis not present

## 2021-05-06 DIAGNOSIS — K219 Gastro-esophageal reflux disease without esophagitis: Secondary | ICD-10-CM | POA: Diagnosis not present

## 2021-05-06 DIAGNOSIS — F1120 Opioid dependence, uncomplicated: Secondary | ICD-10-CM | POA: Diagnosis not present

## 2021-05-06 DIAGNOSIS — I10 Essential (primary) hypertension: Secondary | ICD-10-CM | POA: Diagnosis not present

## 2021-05-11 DIAGNOSIS — K2970 Gastritis, unspecified, without bleeding: Secondary | ICD-10-CM | POA: Diagnosis not present

## 2021-05-11 DIAGNOSIS — K529 Noninfective gastroenteritis and colitis, unspecified: Secondary | ICD-10-CM | POA: Diagnosis not present

## 2021-05-11 DIAGNOSIS — R112 Nausea with vomiting, unspecified: Secondary | ICD-10-CM | POA: Diagnosis not present

## 2021-05-11 DIAGNOSIS — E860 Dehydration: Secondary | ICD-10-CM | POA: Diagnosis not present

## 2021-05-12 ENCOUNTER — Encounter (HOSPITAL_COMMUNITY): Payer: Self-pay

## 2021-05-12 ENCOUNTER — Emergency Department (HOSPITAL_COMMUNITY): Payer: BC Managed Care – PPO

## 2021-05-12 ENCOUNTER — Emergency Department (HOSPITAL_COMMUNITY)
Admission: EM | Admit: 2021-05-12 | Discharge: 2021-05-13 | Disposition: A | Discharge status: Home / Self Care | Payer: BC Managed Care – PPO | Attending: Emergency Medicine | Admitting: Emergency Medicine

## 2021-05-12 ENCOUNTER — Other Ambulatory Visit: Payer: Self-pay

## 2021-05-12 DIAGNOSIS — R197 Diarrhea, unspecified: Secondary | ICD-10-CM | POA: Diagnosis not present

## 2021-05-12 DIAGNOSIS — T50904A Poisoning by unspecified drugs, medicaments and biological substances, undetermined, initial encounter: Secondary | ICD-10-CM | POA: Diagnosis not present

## 2021-05-12 DIAGNOSIS — R0902 Hypoxemia: Secondary | ICD-10-CM | POA: Diagnosis not present

## 2021-05-12 DIAGNOSIS — I10 Essential (primary) hypertension: Secondary | ICD-10-CM | POA: Insufficient documentation

## 2021-05-12 DIAGNOSIS — E860 Dehydration: Secondary | ICD-10-CM

## 2021-05-12 DIAGNOSIS — I499 Cardiac arrhythmia, unspecified: Secondary | ICD-10-CM | POA: Diagnosis not present

## 2021-05-12 DIAGNOSIS — Z79899 Other long term (current) drug therapy: Secondary | ICD-10-CM | POA: Insufficient documentation

## 2021-05-12 DIAGNOSIS — R000 Tachycardia, unspecified: Secondary | ICD-10-CM | POA: Diagnosis not present

## 2021-05-12 DIAGNOSIS — T424X1A Poisoning by benzodiazepines, accidental (unintentional), initial encounter: Secondary | ICD-10-CM | POA: Insufficient documentation

## 2021-05-12 DIAGNOSIS — R1110 Vomiting, unspecified: Secondary | ICD-10-CM | POA: Insufficient documentation

## 2021-05-12 DIAGNOSIS — Z7982 Long term (current) use of aspirin: Secondary | ICD-10-CM | POA: Insufficient documentation

## 2021-05-12 DIAGNOSIS — T50901A Poisoning by unspecified drugs, medicaments and biological substances, accidental (unintentional), initial encounter: Secondary | ICD-10-CM

## 2021-05-12 DIAGNOSIS — Z743 Need for continuous supervision: Secondary | ICD-10-CM | POA: Diagnosis not present

## 2021-05-12 LAB — I-STAT VENOUS BLOOD GAS, ED
Acid-base deficit: 5 mmol/L — ABNORMAL HIGH (ref 0.0–2.0)
Bicarbonate: 20.9 mmol/L (ref 20.0–28.0)
Calcium, Ion: 1.22 mmol/L (ref 1.15–1.40)
HCT: 45 % (ref 39.0–52.0)
Hemoglobin: 15.3 g/dL (ref 13.0–17.0)
O2 Saturation: 84 %
Potassium: 3.7 mmol/L (ref 3.5–5.1)
Sodium: 144 mmol/L (ref 135–145)
TCO2: 22 mmol/L (ref 22–32)
pCO2, Ven: 41.7 mmHg — ABNORMAL LOW (ref 44.0–60.0)
pH, Ven: 7.309 (ref 7.250–7.430)
pO2, Ven: 53 mmHg — ABNORMAL HIGH (ref 32.0–45.0)

## 2021-05-12 LAB — CBC WITH DIFFERENTIAL/PLATELET
Abs Immature Granulocytes: 0.05 10*3/uL (ref 0.00–0.07)
Basophils Absolute: 0 10*3/uL (ref 0.0–0.1)
Basophils Relative: 0 %
Eosinophils Absolute: 0 10*3/uL (ref 0.0–0.5)
Eosinophils Relative: 0 %
HCT: 44.2 % (ref 39.0–52.0)
Hemoglobin: 14.7 g/dL (ref 13.0–17.0)
Immature Granulocytes: 0 %
Lymphocytes Relative: 5 %
Lymphs Abs: 0.6 10*3/uL — ABNORMAL LOW (ref 0.7–4.0)
MCH: 28.5 pg (ref 26.0–34.0)
MCHC: 33.3 g/dL (ref 30.0–36.0)
MCV: 85.8 fL (ref 80.0–100.0)
Monocytes Absolute: 1 10*3/uL (ref 0.1–1.0)
Monocytes Relative: 8 %
Neutro Abs: 11.4 10*3/uL — ABNORMAL HIGH (ref 1.7–7.7)
Neutrophils Relative %: 87 %
Platelets: 223 10*3/uL (ref 150–400)
RBC: 5.15 MIL/uL (ref 4.22–5.81)
RDW: 19.4 % — ABNORMAL HIGH (ref 11.5–15.5)
WBC: 13.1 10*3/uL — ABNORMAL HIGH (ref 4.0–10.5)
nRBC: 0 % (ref 0.0–0.2)

## 2021-05-12 LAB — ACETAMINOPHEN LEVEL: Acetaminophen (Tylenol), Serum: <10 ug/mL — ABNORMAL LOW (ref 10–30)

## 2021-05-12 LAB — COMPREHENSIVE METABOLIC PANEL
ALT: 27 U/L (ref 0–44)
AST: 23 U/L (ref 15–41)
Albumin: 4.4 g/dL (ref 3.5–5.0)
Alkaline Phosphatase: 34 U/L — ABNORMAL LOW (ref 38–126)
Anion gap: 11 (ref 5–15)
BUN: 16 mg/dL (ref 6–20)
CO2: 20 mmol/L — ABNORMAL LOW (ref 22–32)
Calcium: 9.4 mg/dL (ref 8.9–10.3)
Chloride: 109 mmol/L (ref 98–111)
Creatinine, Ser: 1.38 mg/dL — ABNORMAL HIGH (ref 0.61–1.24)
GFR, Estimated: >60 mL/min (ref >60)
Glucose, Bld: 217 mg/dL — ABNORMAL HIGH (ref 70–99)
Potassium: 3.6 mmol/L (ref 3.5–5.1)
Sodium: 140 mmol/L (ref 135–145)
Total Bilirubin: 2.3 mg/dL — ABNORMAL HIGH (ref 0.3–1.2)
Total Protein: 6.9 g/dL (ref 6.5–8.1)

## 2021-05-12 LAB — SALICYLATE LEVEL: Salicylate Lvl: <7 mg/dL — ABNORMAL LOW (ref 7.0–30.0)

## 2021-05-12 LAB — CBG MONITORING, ED: Glucose-Capillary: 199 mg/dL — ABNORMAL HIGH (ref 70–99)

## 2021-05-12 LAB — ETHANOL: Alcohol, Ethyl (B): <10 mg/dL (ref <10)

## 2021-05-12 MED ORDER — SODIUM CHLORIDE 0.9 % IV BOLUS
1000.0000 mL | Freq: Once | INTRAVENOUS | Status: AC
  Administered 2021-05-12: 1000 mL via INTRAVENOUS

## 2021-05-12 NOTE — ED Notes (Signed)
Provider at bedside

## 2021-05-12 NOTE — ED Triage Notes (Signed)
BIB [facility] from home per pt he took Ativan 2mg  and went unresponsive. Wife found pt unresponsive and started CPR. Fire department arrival found pt in resp distress administered Narcan 2mg  intranasal. Not long after pt became aroused and A&Ox4. Hx of Heroin and oxycodone use per pt last used both Aug 15th. PIV 18ga lt wrist saline locked, CBG 213.

## 2021-05-12 NOTE — ED Provider Notes (Signed)
[facility] EMERGENCY DEPARTMENT Provider Note   CSN: [id number] Arrival date & time: 05/12/21  2059     History Chief Complaint  Patient presents with   Drug Overdose    Shane Reese is a 55 y.o. male.  Pt presents to the ED today as a drug overdose.  Pt has a hx of heroin and opiate abuse.  He went to a rehab facility and just got back today.  He said he spent the whole time vomiting and having diarrhea.  He said he went to the hospital last night and received fluids.  He said he wanted to go to sleep tonight, so took 2 mg ativan.  Pt's wife found him unresponsive and started CPR.  The fire dept gave him 2 mg narcan IN and he woke up.  Pt denies using heroin or oxycodone since 8/15.  Pt said he feels fine now, just dehydrated.      Past Medical History:  Diagnosis Date   Adenopathy, cervical 01/06/2014   ADHD (attention deficit hyperactivity disorder)    Alcohol dependence (HCC)    sees [employer].  Abused cocaine and narcotics in the past    Anterior cervical adenopathy 12/22/2013   Attention deficit hyperactivity disorder (ADHD) 07/15/2007   Qualifier: Diagnosis of  By: Clent Ridges MD, Jeannett Senior A    Chronic low back pain 11/10/2015   Depression    Depression with anxiety 06/10/2007   Qualifier: Diagnosis of  By: Claiborne Billings CMA, Jacqualynn     Drug dependence    ED (erectile dysfunction)    ERECTILE DYSFUNCTION, ORGANIC 06/25/2009   Qualifier: Diagnosis of  By: Clent Ridges MD, Tera Mater    Hypertension     Patient Active Problem List   Diagnosis Date Noted   HTN (hypertension) 07/20/2020   Chronic low back pain 11/10/2015   Chronic gingivitis 01/06/2014   Adenopathy, cervical 01/06/2014   Anterior cervical adenopathy 12/22/2013   ACUTE BRONCHITIS 12/15/2009   DENTAL PAIN 12/15/2009   ERECTILE DYSFUNCTION, ORGANIC 06/25/2009   Attention deficit hyperactivity disorder (ADHD) 07/15/2007   Depression with anxiety 06/10/2007    Past Surgical History:   Procedure Laterality Date   l lower leg fx surgery         History reviewed. No pertinent family history.  Social History   Tobacco Use   Smoking status: Never   Smokeless tobacco: Never  Substance Use Topics   Alcohol use: Yes    Alcohol/week: 0.0 standard drinks    Comment: occ   Drug use: Yes    Comment: heroin and oxycodone    Home Medications Prior to Admission medications   Medication Sig Start Date End Date Taking? Authorizing Provider  amphetamine-dextroamphetamine (ADDERALL) 20 MG tablet Take 1 tablet (20 mg total) by mouth 3 (three) times daily. 03/21/21 04/20/21  Nelwyn Salisbury, MD  aspirin EC 81 MG tablet Take 81 mg by mouth daily as needed for mild pain.    [provider]  cloNIDine (CATAPRES) 0.1 MG tablet  10/13/20   [provider]  LORazepam (ATIVAN) 1 MG tablet TAKE 1 TABLET BY MOUTH EVERY 6 HOURS AS NEEDED 03/31/20   Nelwyn Salisbury, MD  metoprolol succinate (TOPROL-XL) 100 MG 24 hr tablet TAKE 1 TABLET(100 MG) BY MOUTH DAILY WITH OR IMMEDIATELY FOLLOWING A MEAL 02/11/21   Nelwyn Salisbury, MD  omeprazole (PRILOSEC) 40 MG capsule TAKE 1 CAPSULE(40 MG) BY MOUTH DAILY 10/06/20   Nelwyn Salisbury, MD  ondansetron (ZOFRAN-ODT)  8 MG disintegrating tablet Take 8 mg by mouth every 8 (eight) hours as needed for nausea/vomiting. 12/14/19   [provider]  tadalafil (CIALIS) 20 MG tablet TAKE ONE TABLET BY MOUTH DAILY AS NEEDED FOR ERECTILE DYSFUNCTION 03/16/20   Nelwyn Salisbury, MD    Allergies    Losartan  Review of Systems   Review of Systems  All other systems reviewed and are negative.  Physical Exam Updated Vital Signs BP 131/80   Pulse (!) 113   Temp 98.9 F (37.2 C) (Oral)   Resp 16   Ht 6\' 2"  (1.88 m)   Wt 94.3 kg   SpO2 94%   BMI 26.71 kg/m   Physical Exam Vitals and nursing note reviewed.  Constitutional:      Appearance: Normal appearance.  HENT:     Head: Normocephalic and atraumatic.     Nose: Nose normal.      Mouth/Throat:     Mouth: Mucous membranes are dry.  Eyes:     Extraocular Movements: Extraocular movements intact.     Conjunctiva/sclera: Conjunctivae normal.     Pupils: Pupils are equal, round, and reactive to light.  Cardiovascular:     Rate and Rhythm: Regular rhythm. Tachycardia present.     Pulses: Normal pulses.     Heart sounds: Normal heart sounds.  Pulmonary:     Effort: Pulmonary effort is normal.     Breath sounds: Normal breath sounds.  Abdominal:     General: Abdomen is flat. Bowel sounds are normal.     Palpations: Abdomen is soft.  Musculoskeletal:        General: Normal range of motion.     Cervical back: Normal range of motion.  Skin:    General: Skin is warm.     Capillary Refill: Capillary refill takes less than 2 seconds.  Neurological:     General: No focal deficit present.     Mental Status: He is alert and oriented to person, place, and time.  Psychiatric:        Mood and Affect: Mood normal.        Behavior: Behavior normal.    ED Results / Procedures / Treatments   Labs (all labs ordered are listed, but only abnormal results are displayed) Labs Reviewed  COMPREHENSIVE METABOLIC PANEL - Abnormal; Notable for the following components:      Result Value   CO2 20 (*)    Glucose, Bld 217 (*)    Creatinine, Ser 1.38 (*)    Alkaline Phosphatase 34 (*)    Total Bilirubin 2.3 (*)    All other components within normal limits  SALICYLATE LEVEL - Abnormal; Notable for the following components:   Salicylate Lvl <7.0 (*)    All other components within normal limits  ACETAMINOPHEN LEVEL - Abnormal; Notable for the following components:   Acetaminophen (Tylenol), Serum <10 (*)    All other components within normal limits  CBC WITH DIFFERENTIAL/PLATELET - Abnormal; Notable for the following components:   WBC 13.1 (*)    RDW 19.4 (*)    Neutro Abs 11.4 (*)    Lymphs Abs 0.6 (*)    All other components within normal limits  CBG MONITORING, ED - Abnormal;  Notable for the following components:   Glucose-Capillary 199 (*)    All other components within normal limits  ETHANOL  RAPID URINE DRUG SCREEN, HOSP PERFORMED  URINALYSIS, ROUTINE W REFLEX MICROSCOPIC  I-STAT VENOUS BLOOD GAS, ED  TROPONIN I (HIGH  SENSITIVITY)    EKG EKG Interpretation  Date/Time:  Thursday May 12 2021 21:03:47 EDT Ventricular Rate:  120 PR Interval:  134 QRS Duration: 91 QT Interval:  319 QTC Calculation: 451 R Axis:   74 Text Interpretation: Sinus tachycardia Repol abnrm, severe global ischemia (LM/MVD) Since last tracing rate faster Confirmed by Jacalyn Lefevre [phone]) on 05/12/2021 10:09:04 PM  Radiology No results found.  Procedures Procedures   Medications Ordered in ED Medications  sodium chloride 0.9 % bolus 1,000 mL (0 mLs Intravenous Stopped 05/12/21 2323)  sodium chloride 0.9 % bolus 1,000 mL (1,000 mLs Intravenous New Bag/Given 05/12/21 2327)    ED Course  I have reviewed the triage vital signs and the nursing notes.  Pertinent labs & imaging results that were available during my care of the patient were reviewed by me and considered in my medical decision making (see chart for details).    MDM Rules/Calculators/A&P                           Pt refused CXR.  I told him that he may have rib fx or aspiration, but he said he does not have any cp and is breathing fine.  He still refuses it.  HR is still elevated after 1L.  Additional fluids ordered.    Pt signed out to Dr. Manus Gunning at shift change.  Final Clinical Impression(s) / ED Diagnoses Final diagnoses:  Accidental drug overdose, initial encounter  Dehydration    Rx / DC Orders ED Discharge Orders     None        Jacalyn Lefevre, MD 05/12/21 2332

## 2021-05-12 NOTE — ED Notes (Signed)
Radiology at bedside at this time.

## 2021-05-13 LAB — RAPID URINE DRUG SCREEN, HOSP PERFORMED
Amphetamines: NOT DETECTED
Barbiturates: NOT DETECTED
Benzodiazepines: POSITIVE — AB
Cocaine: NOT DETECTED
Opiates: NOT DETECTED
Tetrahydrocannabinol: NOT DETECTED

## 2021-05-13 LAB — URINALYSIS, ROUTINE W REFLEX MICROSCOPIC
Bilirubin Urine: NEGATIVE
Glucose, UA: NEGATIVE mg/dL
Hgb urine dipstick: NEGATIVE
Ketones, ur: 20 mg/dL — AB
Leukocytes,Ua: NEGATIVE
Nitrite: NEGATIVE
Protein, ur: NEGATIVE mg/dL
Specific Gravity, Urine: 1.021 (ref 1.005–1.030)
pH: 5 (ref 5.0–8.0)

## 2021-05-13 LAB — TROPONIN I (HIGH SENSITIVITY)
Troponin I (High Sensitivity): 7 ng/L (ref <18)
Troponin I (High Sensitivity): 11 ng/L (ref <18)

## 2021-05-13 MED ORDER — LACTATED RINGERS IV BOLUS
1000.0000 mL | Freq: Once | INTRAVENOUS | Status: AC
  Administered 2021-05-13: 1000 mL via INTRAVENOUS

## 2021-05-13 MED ORDER — METOPROLOL SUCCINATE ER 25 MG PO TB24
100.0000 mg | ORAL_TABLET | Freq: Once | ORAL | Status: AC
  Administered 2021-05-13: 100 mg via ORAL
  Filled 2021-05-13: qty 4

## 2021-05-13 MED ORDER — METOPROLOL SUCCINATE ER 25 MG PO TB24: 100.0000 mg | ORAL_TABLET | Freq: Every day | ORAL | Status: DC

## 2021-05-13 MED ORDER — LORAZEPAM 2 MG/ML IJ SOLN: 1.0000 mg | Freq: Once | INTRAMUSCULAR | Status: DC

## 2021-05-13 NOTE — Discharge Instructions (Addendum)
Follow-up with your primary doctor.  Stop using illicit drugs.  Return to the ED with new or worsening symptoms.

## 2021-05-13 NOTE — ED Notes (Signed)
Spoke with provider and advised if pt heart rate is over 120 give it and if less hold it. Pt current heart rate is 105. Made provider aware and was advised to hold medication at this time

## 2021-05-13 NOTE — ED Provider Notes (Signed)
Care assumed from Dr. Particia Nearing.  Patient found unresponsive at home and received bystander CPR as well as IN Narcan.   States he has been dehydrated due to nausea and vomiting.  Has not had his metoprolol for several weeks.  Tachycardic to the 120s.  Additional IV fluids given. Patient continues to refuse chest x-ray  Denies chest pain, shortness of breath, abdominal pain.  Denies suicidal homicidal thoughts. Labs show nonspecific hyperglycemia without anion gap. No acidosis on VBG.  Patient states he was weaned off of benzodiazepines about 8 days ago and has not had any Valium or Ativan since. No evidence of life threatening withdrawal.   Heart rate has improved to the 90s.  Patient states he feels fine and wishes to go home.  No chest pain or shortness of breath.  Denies abdominal pain, nausea, vomiting, dizziness or lightheadedness.  Denies suicidal or homicidal thoughts.  He is tolerating p.o. and ambulatory.  Discussed follow-up with his PCP.  Avoid illicit drugs, return precautions discussed.   Shane Octave, MD 05/13/21 [phone]

## 2021-05-13 NOTE — ED Notes (Signed)
Provider at bedside at this time

## 2021-05-18 ENCOUNTER — Telehealth: Payer: Self-pay | Admitting: Family Medicine

## 2021-05-18 ENCOUNTER — Encounter: Payer: Self-pay | Admitting: Family Medicine

## 2021-05-18 ENCOUNTER — Telehealth (INDEPENDENT_AMBULATORY_CARE_PROVIDER_SITE_OTHER): Payer: Self-pay | Admitting: Family Medicine

## 2021-05-18 DIAGNOSIS — K219 Gastro-esophageal reflux disease without esophagitis: Secondary | ICD-10-CM

## 2021-05-18 DIAGNOSIS — U071 COVID-19: Secondary | ICD-10-CM | POA: Insufficient documentation

## 2021-05-18 MED ORDER — METOCLOPRAMIDE HCL 10 MG PO TABS: 10.0000 mg | ORAL_TABLET | Freq: Four times a day (QID) | ORAL | 1 refills | Status: DC

## 2021-05-18 MED ORDER — OMEPRAZOLE 40 MG PO CPDR: DELAYED_RELEASE_CAPSULE | ORAL | 3 refills | Status: DC

## 2021-05-18 MED ORDER — ONDANSETRON 8 MG PO TBDP: 8.0000 mg | ORAL_TABLET | Freq: Four times a day (QID) | ORAL | 1 refills | Status: DC | PRN

## 2021-05-18 NOTE — Telephone Encounter (Signed)
Patient is having problems with ulcers and acid reflux. Wife called on behalf of patient to see if he could be prescribed Carafate suspension (generic) to help.    If any questions, good callback number is [phone]  Please Advise

## 2021-05-18 NOTE — Telephone Encounter (Signed)
Please advise 

## 2021-05-18 NOTE — Progress Notes (Signed)
Subjective:    Patient ID: Shane Reese, male    DOB: 12/13/1965, 55 y.o.   MRN: [id number]  HPI Virtual Visit via Video Note  I connected with the patient on 05/18/21 at  2:15 PM EDT by a video enabled telemedicine application and verified that I am speaking with the correct person using two identifiers.  Location patient: home Location provider:work or home office Persons participating in the virtual visit: patient, provider  I discussed the limitations of evaluation and management by telemedicine and the availability of in person appointments. The patient expressed understanding and agreed to proceed.   HPI: Here for nausea and vomiting from a Covid-19 infection. This started a week ago when he was finishing up a stay at a rehab facility in [place]. No abdominal pain or fever. He had some diarrhea but not much. He had a dry cough at first but not now. No SOB. He got back to [employer] this past weekend and he tested positive for the Covid virus on 05-13-21. He has run out of Omeprazole and this is part of the problem, because his GERD has been very active. He is trying to drink fluids but he cannot eat. He has lost about 20 lbs in the past 2 weeks.    ROS: See pertinent positives and negatives per HPI.  Past Medical History:  Diagnosis Date   Adenopathy, cervical 01/06/2014   ADHD (attention deficit hyperactivity disorder)    Alcohol dependence (HCC)    sees [employer].  Abused cocaine and narcotics in the past    Anterior cervical adenopathy 12/22/2013   Attention deficit hyperactivity disorder (ADHD) 07/15/2007   Qualifier: Diagnosis of  By: Clent Ridges MD, Tera Mater    Chronic low back pain 11/10/2015   Depression    Depression with anxiety 06/10/2007   Qualifier: Diagnosis of  By: Claiborne Billings CMA, Jacqualynn     Drug dependence    ED (erectile dysfunction)    ERECTILE DYSFUNCTION, ORGANIC 06/25/2009   Qualifier: Diagnosis of  By: Clent Ridges MD, Tera Mater    Hypertension      Past Surgical History:  Procedure Laterality Date   l lower leg fx surgery      History reviewed. No pertinent family history.   Current Outpatient Medications:    ondansetron (ZOFRAN-ODT) 8 MG disintegrating tablet, Take 8 mg by mouth every 8 (eight) hours as needed for nausea/vomiting., Disp: , Rfl:    aspirin EC 81 MG tablet, Take 81 mg by mouth daily as needed for mild pain. (Patient not taking: Reported on 05/18/2021), Disp: , Rfl:    cloNIDine (CATAPRES) 0.1 MG tablet, Take 0.1 mg by mouth 3 (three) times daily. (Patient not taking: Reported on 05/18/2021), Disp: , Rfl:    LORazepam (ATIVAN) 1 MG tablet, TAKE 1 TABLET BY MOUTH EVERY 6 HOURS AS NEEDED (Patient not taking: Reported on 05/18/2021), Disp: 60 tablet, Rfl: 5   metoprolol succinate (TOPROL-XL) 100 MG 24 hr tablet, TAKE 1 TABLET(100 MG) BY MOUTH DAILY WITH OR IMMEDIATELY FOLLOWING A MEAL (Patient not taking: Reported on 05/18/2021), Disp: 90 tablet, Rfl: 1   omeprazole (PRILOSEC) 40 MG capsule, TAKE 1 CAPSULE(40 MG) BY MOUTH DAILY (Patient not taking: Reported on 05/18/2021), Disp: 90 capsule, Rfl: 3   tadalafil (CIALIS) 20 MG tablet, TAKE ONE TABLET BY MOUTH DAILY AS NEEDED FOR ERECTILE DYSFUNCTION (Patient not taking: Reported on 05/18/2021), Disp: 30 tablet, Rfl: 4  EXAM:  VITALS per patient if applicable:  GENERAL: alert, oriented, appears  well and in no acute distress  HEENT: atraumatic, conjunttiva clear, no obvious abnormalities on inspection of external nose and ears  NECK: normal movements of the head and neck  LUNGS: on inspection no signs of respiratory distress, breathing rate appears normal, no obvious gross SOB, gasping or wheezing  CV: no obvious cyanosis  MS: moves all visible extremities without noticeable abnormality  PSYCH/NEURO: pleasant and cooperative, no obvious depression or anxiety, speech and thought processing grossly intact  ASSESSMENT AND PLAN: Covid-19 infection compounded by GERD. We will  have him take Omeprazole 40 mg BID for a week or two. We will add Reglan 10 mg QID for a week or two. Add Zofran as needed. Recheck prn. Gershon Crane, MD  Discussed the following assessment and plan:  No diagnosis found.     I discussed the assessment and treatment plan with the patient. The patient was provided an opportunity to ask questions and all were answered. The patient agreed with the plan and demonstrated an understanding of the instructions.   The patient was advised to call back or seek an in-person evaluation if the symptoms worsen or if the condition fails to improve as anticipated.      Review of Systems     Objective:   Physical Exam        Assessment & Plan:

## 2021-05-19 NOTE — Telephone Encounter (Signed)
He was seen yesterday

## 2021-05-20 DIAGNOSIS — F1120 Opioid dependence, uncomplicated: Secondary | ICD-10-CM | POA: Diagnosis not present

## 2021-05-23 ENCOUNTER — Encounter: Payer: Self-pay | Admitting: Family Medicine

## 2021-05-23 DIAGNOSIS — I10 Essential (primary) hypertension: Secondary | ICD-10-CM

## 2021-05-23 DIAGNOSIS — R739 Hyperglycemia, unspecified: Secondary | ICD-10-CM

## 2021-05-23 DIAGNOSIS — N529 Male erectile dysfunction, unspecified: Secondary | ICD-10-CM

## 2021-05-24 NOTE — Telephone Encounter (Signed)
The lab orders are in, so he can make a lab appt when he can

## 2021-06-14 ENCOUNTER — Other Ambulatory Visit: Payer: Self-pay

## 2021-06-14 ENCOUNTER — Other Ambulatory Visit (INDEPENDENT_AMBULATORY_CARE_PROVIDER_SITE_OTHER): Payer: BC Managed Care – PPO

## 2021-06-14 DIAGNOSIS — R739 Hyperglycemia, unspecified: Secondary | ICD-10-CM

## 2021-06-14 DIAGNOSIS — N529 Male erectile dysfunction, unspecified: Secondary | ICD-10-CM | POA: Diagnosis not present

## 2021-06-14 DIAGNOSIS — I10 Essential (primary) hypertension: Secondary | ICD-10-CM | POA: Diagnosis not present

## 2021-06-14 LAB — LIPID PANEL
Cholesterol: 201 mg/dL — ABNORMAL HIGH (ref 0–200)
HDL: 41.7 mg/dL (ref >39.00)
LDL Cholesterol: 122 mg/dL — ABNORMAL HIGH (ref 0–99)
NonHDL: 159.03
Total CHOL/HDL Ratio: 5
Triglycerides: 184 mg/dL — ABNORMAL HIGH (ref 0.0–149.0)
VLDL: 36.8 mg/dL (ref 0.0–40.0)

## 2021-06-14 LAB — HEPATIC FUNCTION PANEL
ALT: 35 U/L (ref 0–53)
AST: 20 U/L (ref 0–37)
Albumin: 4.7 g/dL (ref 3.5–5.2)
Alkaline Phosphatase: 44 U/L (ref 39–117)
Bilirubin, Direct: 0.2 mg/dL (ref 0.0–0.3)
Total Bilirubin: 1 mg/dL (ref 0.2–1.2)
Total Protein: 7 g/dL (ref 6.0–8.3)

## 2021-06-14 LAB — CBC WITH DIFFERENTIAL/PLATELET
Basophils Absolute: 0 10*3/uL (ref 0.0–0.1)
Basophils Relative: 0.7 % (ref 0.0–3.0)
Eosinophils Absolute: 0.1 10*3/uL (ref 0.0–0.7)
Eosinophils Relative: 1.1 % (ref 0.0–5.0)
HCT: 46.2 % (ref 39.0–52.0)
Hemoglobin: 15.3 g/dL (ref 13.0–17.0)
Lymphocytes Relative: 31.7 % (ref 12.0–46.0)
Lymphs Abs: 2.3 10*3/uL (ref 0.7–4.0)
MCHC: 33.1 g/dL (ref 30.0–36.0)
MCV: 88 fl (ref 78.0–100.0)
Monocytes Absolute: 0.6 10*3/uL (ref 0.1–1.0)
Monocytes Relative: 7.7 % (ref 3.0–12.0)
Neutro Abs: 4.2 10*3/uL (ref 1.4–7.7)
Neutrophils Relative %: 58.8 % (ref 43.0–77.0)
Platelets: 206 10*3/uL (ref 150.0–400.0)
RBC: 5.25 Mil/uL (ref 4.22–5.81)
RDW: 17.6 % — ABNORMAL HIGH (ref 11.5–15.5)
WBC: 7.2 10*3/uL (ref 4.0–10.5)

## 2021-06-14 LAB — BASIC METABOLIC PANEL
BUN: 14 mg/dL (ref 6–23)
CO2: 24 mEq/L (ref 19–32)
Calcium: 9.9 mg/dL (ref 8.4–10.5)
Chloride: 104 mEq/L (ref 96–112)
Creatinine, Ser: 0.97 mg/dL (ref 0.40–1.50)
GFR: 89.4 mL/min (ref >60.00)
Glucose, Bld: 96 mg/dL (ref 70–99)
Potassium: 4.3 mEq/L (ref 3.5–5.1)
Sodium: 140 mEq/L (ref 135–145)

## 2021-06-14 LAB — TESTOSTERONE: Testosterone: 246.41 ng/dL — ABNORMAL LOW (ref 300.00–890.00)

## 2021-06-14 LAB — TSH: TSH: 2.12 u[IU]/mL (ref 0.35–5.50)

## 2021-06-14 LAB — PSA: PSA: 0.73 ng/mL (ref 0.10–4.00)

## 2021-06-14 LAB — HEMOGLOBIN A1C: Hgb A1c MFr Bld: 5.8 % (ref 4.6–6.5)

## 2021-06-16 ENCOUNTER — Encounter: Payer: Self-pay | Admitting: Family Medicine

## 2021-06-21 ENCOUNTER — Encounter: Payer: Self-pay | Admitting: Family Medicine

## 2021-06-21 MED ORDER — AMPHETAMINE-DEXTROAMPHETAMINE 20 MG PO TABS: 20.0000 mg | ORAL_TABLET | Freq: Three times a day (TID) | ORAL | 0 refills | Status: DC

## 2021-06-21 MED ORDER — TESTOSTERONE CYPIONATE 200 MG/ML IM SOLN: 200.0000 mg | INTRAMUSCULAR | 3 refills | Status: DC

## 2021-06-21 MED ORDER — "SYRINGE 22G X 1-1/2"" 3 ML MISC": 1.0000 "application " | 3 refills | Status: AC

## 2021-06-21 NOTE — Telephone Encounter (Signed)
I sent this in

## 2021-06-21 NOTE — Telephone Encounter (Signed)
Done

## 2021-08-13 ENCOUNTER — Other Ambulatory Visit: Payer: Self-pay | Admitting: Family Medicine

## 2021-08-19 ENCOUNTER — Encounter: Payer: Self-pay | Admitting: Family Medicine

## 2021-08-19 ENCOUNTER — Other Ambulatory Visit: Payer: Self-pay

## 2021-08-19 MED ORDER — TADALAFIL 20 MG PO TABS: ORAL_TABLET | ORAL | 1 refills | Status: DC

## 2021-10-11 ENCOUNTER — Other Ambulatory Visit: Payer: Self-pay | Admitting: Family Medicine

## 2021-10-12 MED ORDER — AMPHETAMINE-DEXTROAMPHETAMINE 20 MG PO TABS: 20.0000 mg | ORAL_TABLET | Freq: Three times a day (TID) | ORAL | 0 refills | Status: DC

## 2021-10-12 NOTE — Telephone Encounter (Signed)
Done

## 2021-10-18 ENCOUNTER — Telehealth: Payer: Self-pay

## 2021-10-18 NOTE — Telephone Encounter (Signed)
Dr Sarajane Jews received an form form pt dentist office for a dental procedure, Dr fry advised to have pt schedule appointment to review and complete the form, Left detailed message for pt to cal the office to schedule office appointment.

## 2021-10-25 ENCOUNTER — Ambulatory Visit (INDEPENDENT_AMBULATORY_CARE_PROVIDER_SITE_OTHER): Payer: 59 | Admitting: Family Medicine

## 2021-10-25 ENCOUNTER — Encounter: Payer: Self-pay | Admitting: Family Medicine

## 2021-10-25 VITALS — BP 118/80 | HR 92 | Temp 98.2°F | Ht 72.0 in | Wt 225.0 lb

## 2021-10-25 DIAGNOSIS — K089 Disorder of teeth and supporting structures, unspecified: Secondary | ICD-10-CM | POA: Diagnosis not present

## 2021-10-25 DIAGNOSIS — Z1211 Encounter for screening for malignant neoplasm of colon: Secondary | ICD-10-CM | POA: Diagnosis not present

## 2021-10-25 DIAGNOSIS — Z01818 Encounter for other preprocedural examination: Secondary | ICD-10-CM

## 2021-10-25 MED ORDER — METOPROLOL SUCCINATE ER 100 MG PO TB24: 50.0000 mg | ORAL_TABLET | Freq: Every day | ORAL | 0 refills | Status: DC

## 2021-10-25 NOTE — Progress Notes (Signed)
° °  Subjective:    Patient ID: Shane Reese, male    DOB: [birth year], 56 y.o.   MRN: [id number]  HPI Here for surgical clearance. He is scheduled for some tooth extractions and some dental implants per Fara Chute DDS on 11-01-21. Otherwise he feels fine.    Review of Systems  Constitutional: Negative.   HENT:  Positive for dental problem.   Respiratory: Negative.    Cardiovascular: Negative.   Gastrointestinal: Negative.   Genitourinary: Negative.   Neurological: Negative.       Objective:   Physical Exam Constitutional:      Appearance: Normal appearance.  Cardiovascular:     Rate and Rhythm: Normal rate and regular rhythm.     Pulses: Normal pulses.     Heart sounds: Normal heart sounds.  Pulmonary:     Effort: Pulmonary effort is normal.     Breath sounds: Normal breath sounds.  Abdominal:     General: Abdomen is flat. Bowel sounds are normal. There is no distension.     Palpations: Abdomen is soft. There is no mass.     Tenderness: There is no abdominal tenderness. There is no guarding or rebound.     Hernia: No hernia is present.  Musculoskeletal:     Cervical back: Neck supple.     Right lower leg: No edema.     Left lower leg: No edema.  Lymphadenopathy:     Cervical: No cervical adenopathy.  Neurological:     General: No focal deficit present.     Mental Status: He is alert and oriented to person, place, and time. Mental status is at baseline.          Assessment & Plan:  Screening for dental procedures. He is cleared for the surgeries. He will take all his usual medications that morning. He will avoid taking NSAID's or aspirin for 3 days prior to the surgeries.  Gershon Crane, MD

## 2021-11-14 ENCOUNTER — Other Ambulatory Visit: Payer: Self-pay | Admitting: Family Medicine

## 2021-11-16 ENCOUNTER — Encounter: Payer: Self-pay | Admitting: Family Medicine

## 2021-11-16 DIAGNOSIS — E291 Testicular hypofunction: Secondary | ICD-10-CM

## 2021-11-16 DIAGNOSIS — F900 Attention-deficit hyperactivity disorder, predominantly inattentive type: Secondary | ICD-10-CM

## 2021-11-16 NOTE — Telephone Encounter (Signed)
I do not see a testosterone test result anywhere since last October. I will put in a new order so he can come by the lab and give another sample.  ?

## 2021-11-21 NOTE — Telephone Encounter (Signed)
Pt notified that cologuard results have not been received ?

## 2021-11-21 NOTE — Telephone Encounter (Signed)
Pt cologuard results have not been received yet ?

## 2021-12-08 ENCOUNTER — Encounter: Payer: Self-pay | Admitting: Family Medicine

## 2021-12-08 ENCOUNTER — Telehealth: Payer: Self-pay

## 2021-12-08 NOTE — Telephone Encounter (Signed)
Pt Cologuard  results have been received and abstracted, copy sent to scanning. Pt is aware of results ?

## 2021-12-26 IMAGING — DX DG CHEST 2V
2 series · 2 of 2 positions shown · non-contrast
Comparison: May 15, 2018

CLINICAL DATA: Chest pain and shortness of breath x3 weeks.

EXAM:
CHEST - 2 VIEW

[chest pa]
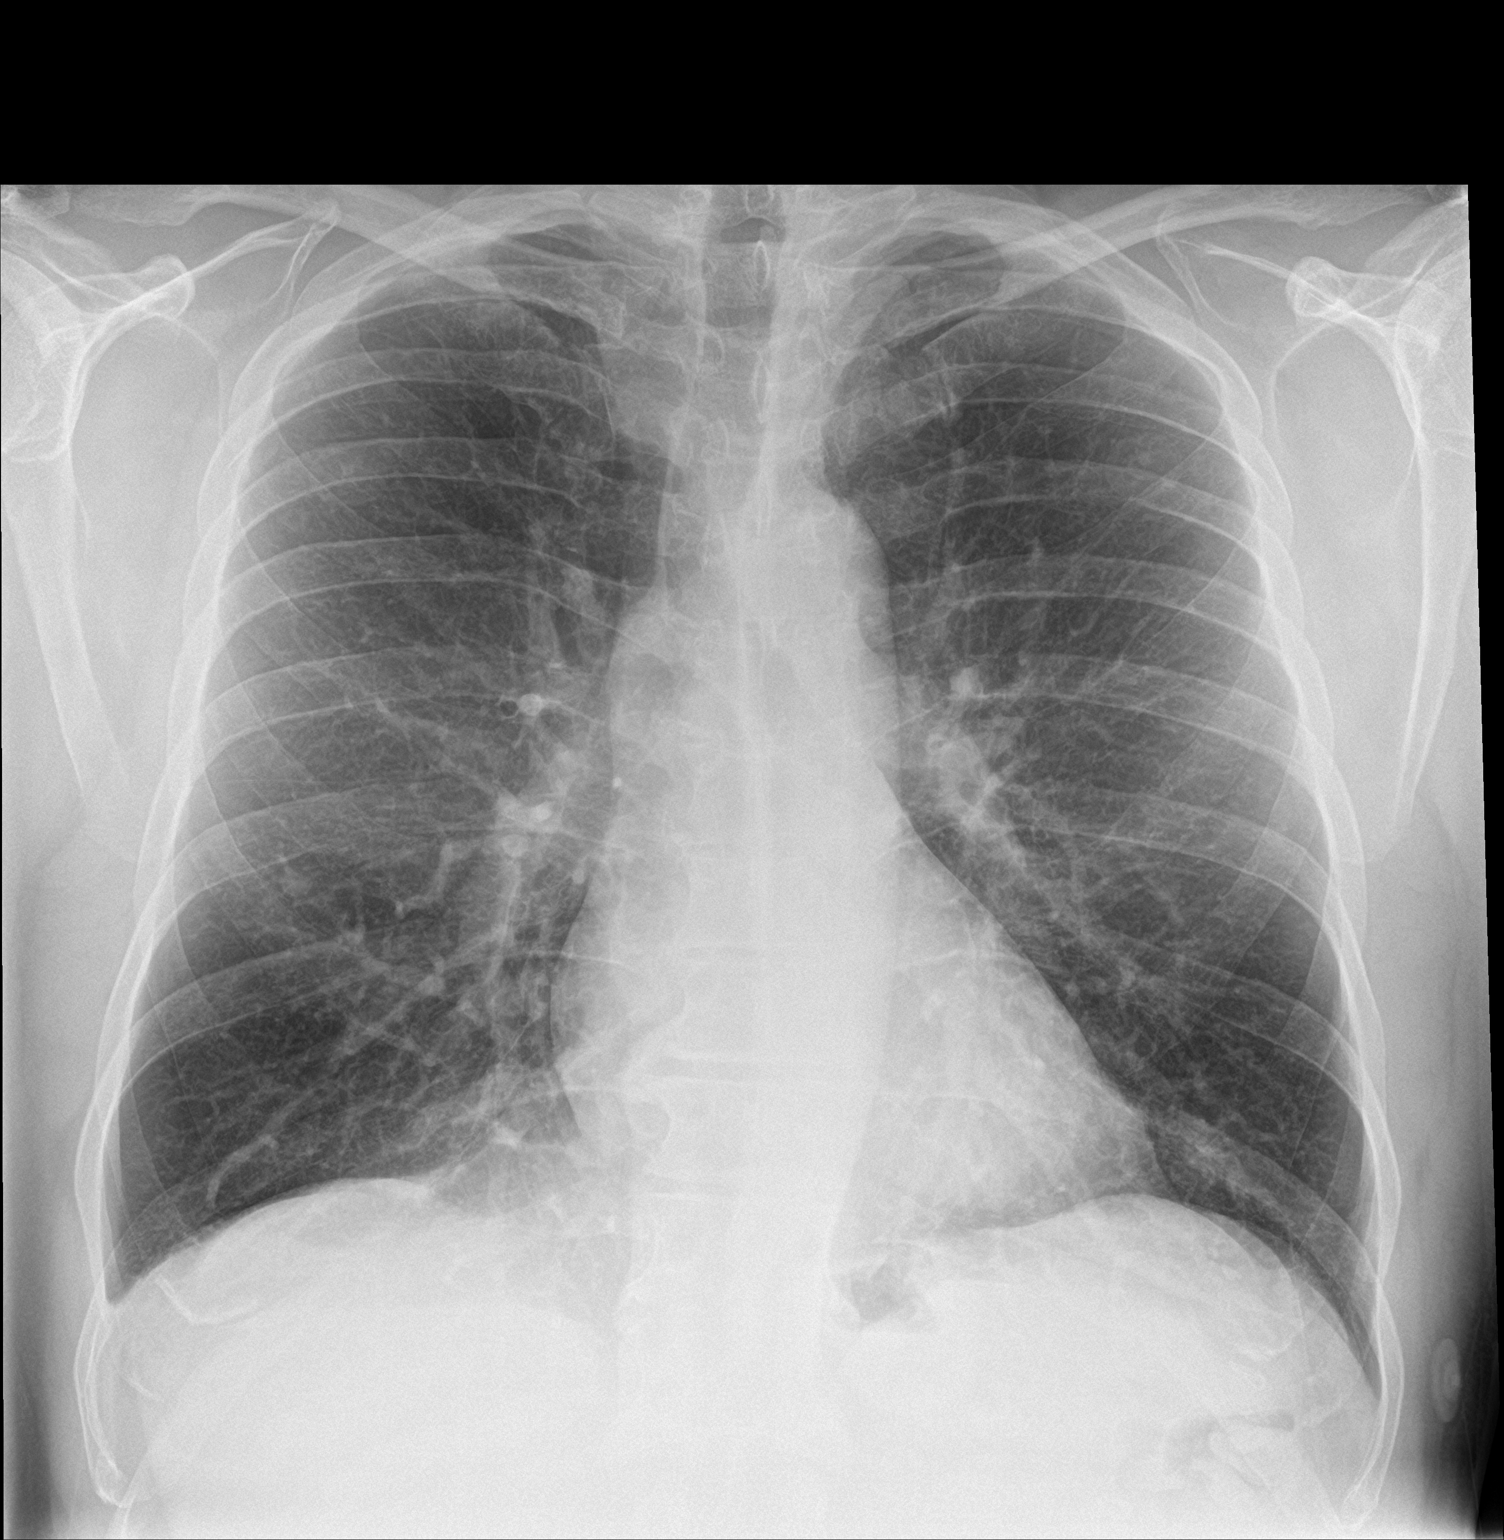

[chest lat]
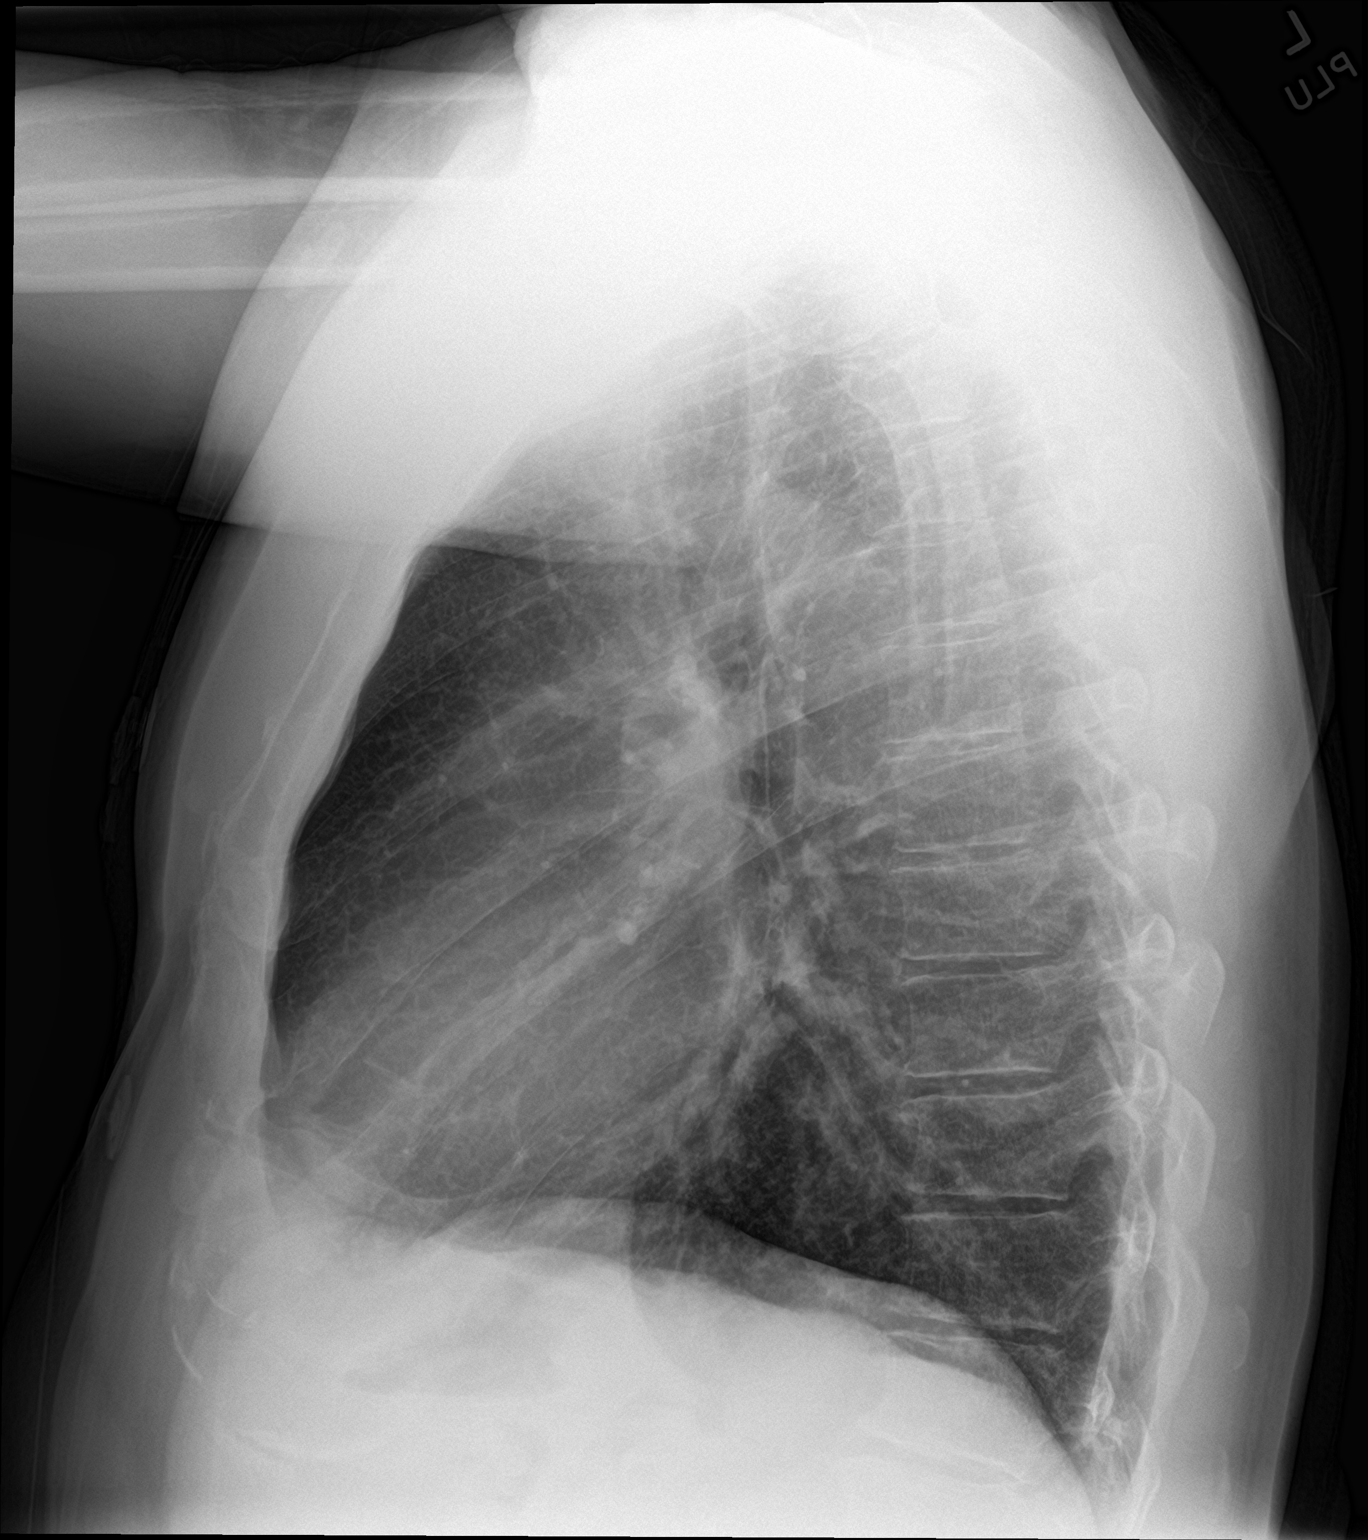

[2 of 2 positions shown; findings below may reference images not displayed]

FINDINGS: There is no evidence of acute infiltrate, pleural effusion or
pneumothorax. The heart size and mediastinal contours are within
normal limits. The visualized skeletal structures are unremarkable.
IMPRESSION: No active cardiopulmonary disease.

## 2022-01-15 ENCOUNTER — Other Ambulatory Visit: Payer: Self-pay | Admitting: Family Medicine

## 2022-01-16 MED ORDER — AMPHETAMINE-DEXTROAMPHETAMINE 20 MG PO TABS: 20.0000 mg | ORAL_TABLET | Freq: Three times a day (TID) | ORAL | 0 refills | Status: DC

## 2022-01-16 NOTE — Telephone Encounter (Signed)
Done

## 2022-01-16 NOTE — Telephone Encounter (Signed)
Pt LOV was on 10/25/2021 ?Last refill done on 12/10/2021 for 90 tablets ?Pt takes  TID, please advise ?

## 2022-01-20 ENCOUNTER — Telehealth: Payer: Self-pay

## 2022-01-20 NOTE — Telephone Encounter (Signed)
Shane Reese KeyHenreitta Leber - PA Case ID: [id number] - Rx #: [id number] Need help? Call us at [phone] ?Outcome ?Approvedtoday ?Your PA request has been approved. Additional information will be provided in the approval communication. (Message 1145) ?Drug ?Omeprazole 40MG  dr capsules ?Form ?[employer] Electronic PA Form [phone] NCPDP) ?Original Claim Info ?56 ?Health Plan?s Preferred Products ?ESOMEPRA MAG CAP 40MG  DR 73 ?ESOMEPRAZOLE GRA 40MG  DR ? ?

## 2022-02-07 ENCOUNTER — Encounter: Payer: Self-pay | Admitting: Family Medicine

## 2022-03-08 ENCOUNTER — Encounter: Payer: Self-pay | Admitting: Family Medicine

## 2022-03-13 NOTE — Telephone Encounter (Signed)
We need to check a testosterone level first. The order is already in place, so he can schedule a lab appt

## 2022-04-04 ENCOUNTER — Encounter: Payer: Self-pay | Admitting: Family Medicine

## 2022-04-05 MED ORDER — AMPHETAMINE-DEXTROAMPHETAMINE 20 MG PO TABS: 20.0000 mg | ORAL_TABLET | Freq: Three times a day (TID) | ORAL | 0 refills | Status: DC

## 2022-04-05 NOTE — Telephone Encounter (Signed)
Done

## 2022-04-24 ENCOUNTER — Encounter: Payer: Self-pay | Admitting: Family Medicine

## 2022-05-04 ENCOUNTER — Other Ambulatory Visit (INDEPENDENT_AMBULATORY_CARE_PROVIDER_SITE_OTHER): Payer: 59

## 2022-05-04 DIAGNOSIS — E291 Testicular hypofunction: Secondary | ICD-10-CM

## 2022-05-05 LAB — TESTOSTERONE: Testosterone: 187.12 ng/dL — ABNORMAL LOW (ref 300.00–890.00)

## 2022-05-10 ENCOUNTER — Encounter: Payer: Self-pay | Admitting: Family Medicine

## 2022-05-11 NOTE — Telephone Encounter (Signed)
Awaiting reply from Testosterone labs 05/04/22

## 2022-05-17 MED ORDER — TESTOSTERONE CYPIONATE 200 MG/ML IM SOLN: 200.0000 mg | INTRAMUSCULAR | 3 refills | Status: DC

## 2022-05-17 NOTE — Telephone Encounter (Signed)
I sent in the refill.

## 2022-05-18 NOTE — Progress Notes (Signed)
Contact patient. Left a detail message that we release the result in mychart. For patient to call us back if have any question.

## 2022-06-20 ENCOUNTER — Encounter: Payer: Self-pay | Admitting: Family Medicine

## 2022-06-21 MED ORDER — AMPHETAMINE-DEXTROAMPHETAMINE 20 MG PO TABS: 20.0000 mg | ORAL_TABLET | Freq: Three times a day (TID) | ORAL | 0 refills | Status: DC

## 2022-06-21 NOTE — Telephone Encounter (Signed)
I sent in a 30 day supply to [facility]

## 2022-07-25 ENCOUNTER — Encounter: Payer: Self-pay | Admitting: Family Medicine

## 2022-07-25 DIAGNOSIS — N529 Male erectile dysfunction, unspecified: Secondary | ICD-10-CM

## 2022-07-26 ENCOUNTER — Other Ambulatory Visit: Payer: Self-pay

## 2022-07-26 DIAGNOSIS — N529 Male erectile dysfunction, unspecified: Secondary | ICD-10-CM

## 2022-07-26 MED ORDER — AMPHETAMINE-DEXTROAMPHETAMINE 20 MG PO TABS
20.0000 mg | ORAL_TABLET | Freq: Three times a day (TID) | ORAL | 0 refills | Status: DC
Start: 2022-09-25 — End: 2022-11-22

## 2022-07-26 MED ORDER — TADALAFIL 20 MG PO TABS: ORAL_TABLET | ORAL | 0 refills | Status: DC

## 2022-07-26 MED ORDER — AMPHETAMINE-DEXTROAMPHETAMINE 20 MG PO TABS: 20.0000 mg | ORAL_TABLET | Freq: Three times a day (TID) | ORAL | 0 refills | Status: DC

## 2022-07-26 MED ORDER — TADALAFIL 20 MG PO TABS: ORAL_TABLET | ORAL | 11 refills | Status: DC

## 2022-07-26 NOTE — Telephone Encounter (Signed)
Done

## 2022-07-27 ENCOUNTER — Encounter: Payer: Self-pay | Admitting: Family Medicine

## 2022-07-28 ENCOUNTER — Encounter: Payer: Self-pay | Admitting: Family Medicine

## 2022-07-28 NOTE — Telephone Encounter (Signed)
The letter is ready  

## 2022-08-01 ENCOUNTER — Encounter: Payer: Self-pay | Admitting: Family Medicine

## 2022-08-01 NOTE — Telephone Encounter (Signed)
The new letter is ready  

## 2022-08-02 ENCOUNTER — Encounter: Payer: Self-pay | Admitting: Family Medicine

## 2022-08-02 NOTE — Telephone Encounter (Signed)
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 Spoke with Dr Clent Ridges regarding this and he approved to write another letter with pt request, pt aware that letter was sent to his MyChart as he  requested

## 2022-08-07 NOTE — Telephone Encounter (Signed)
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

## 2022-08-08 NOTE — Telephone Encounter (Signed)
Letter provided via MyChart

## 2022-08-24 ENCOUNTER — Encounter: Payer: Self-pay | Admitting: Family Medicine

## 2022-08-25 MED ORDER — TESTOSTERONE CYPIONATE 200 MG/ML IM SOLN: 200.0000 mg | INTRAMUSCULAR | 3 refills | Status: DC

## 2022-08-25 NOTE — Telephone Encounter (Signed)
I refilled the testosterone. He already has refills for Adderall until Feb. 14

## 2022-09-05 ENCOUNTER — Other Ambulatory Visit (HOSPITAL_COMMUNITY): Payer: Self-pay

## 2022-09-05 ENCOUNTER — Telehealth: Payer: Self-pay

## 2022-09-05 NOTE — Telephone Encounter (Signed)
Noted  

## 2022-09-05 NOTE — Telephone Encounter (Signed)
Prior authorization is required for Amphetamine-Dextroamphetamine 20MG  tablets. PA submitted and APPROVED on 09/05/22.  Key 09/07/22 Effective: 09/05/2022 - 09/05/2023

## 2022-09-05 NOTE — Telephone Encounter (Signed)
MyChart message sent to pt regarding approval of the adderall

## 2022-09-06 ENCOUNTER — Telehealth: Payer: Self-pay

## 2022-09-06 ENCOUNTER — Other Ambulatory Visit (HOSPITAL_COMMUNITY): Payer: Self-pay

## 2022-09-06 NOTE — Telephone Encounter (Signed)
Patient Advocate Encounter   Received notification from [employer] that prior authorization for Testosterone Cypionate 200MG /ML is required.   PA submitted on 09/06/2022 Key [id number] Status is pending       09/08/2022, [facility] Pharmacy Patient Advocate Specialist [facility] Patient Advocate Team Direct Number: [phone] Fax: [phone]

## 2022-09-06 NOTE — Telephone Encounter (Signed)
Pharmacy Patient Advocate Encounter  Prior Authorization for Testosterone Cypionate 200mg /ml has been approved.    PA# Effective dates: 09/06/2022 through 09/06/2023  Spoke with Pharmacy to process.

## 2022-11-22 ENCOUNTER — Encounter: Payer: Self-pay | Admitting: Family Medicine

## 2022-11-22 DIAGNOSIS — N529 Male erectile dysfunction, unspecified: Secondary | ICD-10-CM

## 2022-11-22 MED ORDER — AMPHETAMINE-DEXTROAMPHETAMINE 20 MG PO TABS: 20.0000 mg | ORAL_TABLET | Freq: Three times a day (TID) | ORAL | 0 refills | Status: DC

## 2022-11-22 MED ORDER — TADALAFIL 20 MG PO TABS: ORAL_TABLET | ORAL | 11 refills | Status: DC

## 2022-11-22 NOTE — Telephone Encounter (Signed)
Done

## 2023-02-22 ENCOUNTER — Encounter: Payer: Self-pay | Admitting: Family Medicine

## 2023-02-23 MED ORDER — AMPHETAMINE-DEXTROAMPHETAMINE 20 MG PO TABS: 20.0000 mg | ORAL_TABLET | Freq: Three times a day (TID) | ORAL | 0 refills | Status: DC

## 2023-02-23 NOTE — Telephone Encounter (Signed)
Done

## 2023-04-04 ENCOUNTER — Encounter: Payer: Self-pay | Admitting: Family Medicine

## 2023-04-04 ENCOUNTER — Ambulatory Visit (INDEPENDENT_AMBULATORY_CARE_PROVIDER_SITE_OTHER): Payer: No Typology Code available for payment source | Admitting: Family Medicine

## 2023-04-04 VITALS — BP 132/86 | HR 75 | Temp 98.3°F | Wt 206.0 lb

## 2023-04-04 DIAGNOSIS — K219 Gastro-esophageal reflux disease without esophagitis: Secondary | ICD-10-CM

## 2023-04-04 DIAGNOSIS — R739 Hyperglycemia, unspecified: Secondary | ICD-10-CM

## 2023-04-04 DIAGNOSIS — I10 Essential (primary) hypertension: Secondary | ICD-10-CM

## 2023-04-04 DIAGNOSIS — N401 Enlarged prostate with lower urinary tract symptoms: Secondary | ICD-10-CM

## 2023-04-04 DIAGNOSIS — F900 Attention-deficit hyperactivity disorder, predominantly inattentive type: Secondary | ICD-10-CM | POA: Diagnosis not present

## 2023-04-04 DIAGNOSIS — N529 Male erectile dysfunction, unspecified: Secondary | ICD-10-CM

## 2023-04-04 DIAGNOSIS — Z1211 Encounter for screening for malignant neoplasm of colon: Secondary | ICD-10-CM

## 2023-04-04 DIAGNOSIS — N138 Other obstructive and reflux uropathy: Secondary | ICD-10-CM

## 2023-04-04 MED ORDER — AMPHETAMINE-DEXTROAMPHETAMINE 20 MG PO TABS: 20.0000 mg | ORAL_TABLET | Freq: Three times a day (TID) | ORAL | 0 refills | Status: DC

## 2023-04-04 MED ORDER — SILDENAFIL CITRATE 100 MG PO TABS: 100.0000 mg | ORAL_TABLET | ORAL | 11 refills | Status: DC | PRN

## 2023-04-04 MED ORDER — OMEPRAZOLE 40 MG PO CPDR: DELAYED_RELEASE_CAPSULE | ORAL | 3 refills | Status: DC

## 2023-04-04 NOTE — Progress Notes (Signed)
   Subjective:    Patient ID: KEAGAN BRISLIN, male    DOB: [birth year], 57 y.o.   MRN: [id number]  HPI Here to follow up on issues. He has been feeling very well, although his ED has been a problem. He still gives himself testosterone shots, but the Cialis does not help like it uses to. His GERD is stable. His ADHD is stable. His BP has been stable at home, and he says he weaned himself off Metoprolol 6 months ago. His anxiety is stable.    Review of Systems  Constitutional: Negative.   Respiratory: Negative.    Cardiovascular: Negative.   Gastrointestinal: Negative.   Neurological: Negative.   Psychiatric/Behavioral: Negative.         Objective:   Physical Exam Constitutional:      Appearance: Normal appearance.  Cardiovascular:     Rate and Rhythm: Normal rate and regular rhythm.     Pulses: Normal pulses.     Heart sounds: Normal heart sounds.  Pulmonary:     Effort: Pulmonary effort is normal.     Breath sounds: Normal breath sounds.  Neurological:     Mental Status: He is alert.           Assessment & Plan:  His HTN is now stable off medications. His ADHD and GERD are stable. His anxiety is stable. For the ED, we will check a testosterone level and he will try Viagra. We spent a total of ( 33  ) minutes reviewing records and discussing these issues.   Gershon Crane, MD

## 2023-07-06 ENCOUNTER — Encounter: Payer: Self-pay | Admitting: Family Medicine

## 2023-07-06 MED ORDER — AMPHETAMINE-DEXTROAMPHETAMINE 20 MG PO TABS: 20.0000 mg | ORAL_TABLET | Freq: Three times a day (TID) | ORAL | 0 refills | Status: DC

## 2023-07-06 MED ORDER — SILDENAFIL CITRATE 100 MG PO TABS: 100.0000 mg | ORAL_TABLET | ORAL | 11 refills | Status: DC | PRN

## 2023-07-06 NOTE — Telephone Encounter (Signed)
done

## 2023-10-11 ENCOUNTER — Other Ambulatory Visit: Payer: Self-pay | Admitting: Family Medicine

## 2023-10-12 MED ORDER — AMPHETAMINE-DEXTROAMPHETAMINE 20 MG PO TABS: 20.0000 mg | ORAL_TABLET | Freq: Three times a day (TID) | ORAL | 0 refills | Status: DC

## 2023-10-12 NOTE — Telephone Encounter (Signed)
 Done

## 2023-10-12 NOTE — Telephone Encounter (Signed)
Pt LOV was 04/04/23 Please advise

## 2023-11-27 ENCOUNTER — Other Ambulatory Visit: Payer: Self-pay | Admitting: Family Medicine

## 2023-11-27 DIAGNOSIS — N529 Male erectile dysfunction, unspecified: Secondary | ICD-10-CM

## 2024-01-08 ENCOUNTER — Encounter: Payer: Self-pay | Admitting: Family Medicine

## 2024-01-10 ENCOUNTER — Other Ambulatory Visit (INDEPENDENT_AMBULATORY_CARE_PROVIDER_SITE_OTHER): Payer: Self-pay

## 2024-01-10 DIAGNOSIS — N529 Male erectile dysfunction, unspecified: Secondary | ICD-10-CM

## 2024-01-10 DIAGNOSIS — N138 Other obstructive and reflux uropathy: Secondary | ICD-10-CM | POA: Diagnosis not present

## 2024-01-10 DIAGNOSIS — R739 Hyperglycemia, unspecified: Secondary | ICD-10-CM | POA: Diagnosis not present

## 2024-01-10 DIAGNOSIS — I10 Essential (primary) hypertension: Secondary | ICD-10-CM

## 2024-01-10 DIAGNOSIS — N401 Enlarged prostate with lower urinary tract symptoms: Secondary | ICD-10-CM

## 2024-01-10 LAB — BASIC METABOLIC PANEL WITH GFR
BUN: 18 mg/dL (ref 6–23)
CO2: 28 meq/L (ref 19–32)
Calcium: 9.3 mg/dL (ref 8.4–10.5)
Chloride: 101 meq/L (ref 96–112)
Creatinine, Ser: 1.13 mg/dL (ref 0.40–1.50)
GFR: 73.1 mL/min (ref >60.00)
Glucose, Bld: 96 mg/dL (ref 70–99)
Potassium: 4.3 meq/L (ref 3.5–5.1)
Sodium: 136 meq/L (ref 135–145)

## 2024-01-10 LAB — HEPATIC FUNCTION PANEL
ALT: 21 U/L (ref 0–53)
AST: 21 U/L (ref 0–37)
Albumin: 4.6 g/dL (ref 3.5–5.2)
Alkaline Phosphatase: 37 U/L — ABNORMAL LOW (ref 39–117)
Bilirubin, Direct: 0.2 mg/dL (ref 0.0–0.3)
Total Bilirubin: 1.5 mg/dL — ABNORMAL HIGH (ref 0.2–1.2)
Total Protein: 6.9 g/dL (ref 6.0–8.3)

## 2024-01-10 LAB — CBC WITH DIFFERENTIAL/PLATELET
Basophils Absolute: 0.1 10*3/uL (ref 0.0–0.1)
Basophils Relative: 1 % (ref 0.0–3.0)
Eosinophils Absolute: 0.1 10*3/uL (ref 0.0–0.7)
Eosinophils Relative: 1 % (ref 0.0–5.0)
HCT: 53.8 % — ABNORMAL HIGH (ref 39.0–52.0)
Hemoglobin: 17.8 g/dL — ABNORMAL HIGH (ref 13.0–17.0)
Lymphocytes Relative: 28.1 % (ref 12.0–46.0)
Lymphs Abs: 1.9 10*3/uL (ref 0.7–4.0)
MCHC: 33.1 g/dL (ref 30.0–36.0)
MCV: 94.7 fl (ref 78.0–100.0)
Monocytes Absolute: 0.6 10*3/uL (ref 0.1–1.0)
Monocytes Relative: 9 % (ref 3.0–12.0)
Neutro Abs: 4.2 10*3/uL (ref 1.4–7.7)
Neutrophils Relative %: 60.9 % (ref 43.0–77.0)
Platelets: 199 10*3/uL (ref 150.0–400.0)
RBC: 5.68 Mil/uL (ref 4.22–5.81)
RDW: 13.7 % (ref 11.5–15.5)
WBC: 6.9 10*3/uL (ref 4.0–10.5)

## 2024-01-10 LAB — HEMOGLOBIN A1C: Hgb A1c MFr Bld: 5.6 % (ref 4.6–6.5)

## 2024-01-10 LAB — TSH: TSH: 2.12 u[IU]/mL (ref 0.35–5.50)

## 2024-01-10 LAB — LIPID PANEL
Cholesterol: 137 mg/dL (ref 0–200)
HDL: 34.4 mg/dL — ABNORMAL LOW (ref >39.00)
LDL Cholesterol: 88 mg/dL (ref 0–99)
NonHDL: 102.68
Total CHOL/HDL Ratio: 4
Triglycerides: 72 mg/dL (ref 0.0–149.0)
VLDL: 14.4 mg/dL (ref 0.0–40.0)

## 2024-01-10 LAB — TESTOSTERONE: Testosterone: 884.12 ng/dL (ref 300.00–890.00)

## 2024-01-10 LAB — PSA: PSA: 1.15 ng/mL (ref 0.10–4.00)

## 2024-01-14 ENCOUNTER — Other Ambulatory Visit: Payer: Self-pay | Admitting: Family Medicine

## 2024-01-14 ENCOUNTER — Encounter: Payer: Self-pay | Admitting: *Deleted

## 2024-01-16 MED ORDER — AMPHETAMINE-DEXTROAMPHETAMINE 20 MG PO TABS: 20.0000 mg | ORAL_TABLET | Freq: Three times a day (TID) | ORAL | 0 refills | Status: DC

## 2024-01-16 NOTE — Telephone Encounter (Signed)
 Done

## 2024-02-03 ENCOUNTER — Other Ambulatory Visit: Payer: Self-pay | Admitting: Family Medicine

## 2024-02-05 ENCOUNTER — Encounter: Payer: Self-pay | Admitting: Family Medicine

## 2024-02-05 NOTE — Telephone Encounter (Signed)
 Pt LOV was on 04/04/23 Last refill done on 12/15/2 Please advise

## 2024-02-06 MED ORDER — SILDENAFIL CITRATE 100 MG PO TABS: 100.0000 mg | ORAL_TABLET | ORAL | 11 refills | Status: AC | PRN

## 2024-02-06 NOTE — Telephone Encounter (Signed)
I sent in the Viagra

## 2024-04-10 ENCOUNTER — Encounter: Payer: Self-pay | Admitting: Gastroenterology

## 2024-04-23 ENCOUNTER — Encounter: Payer: Self-pay | Admitting: Family Medicine

## 2024-04-25 MED ORDER — AMPHETAMINE-DEXTROAMPHETAMINE 20 MG PO TABS: 20.0000 mg | ORAL_TABLET | Freq: Three times a day (TID) | ORAL | 0 refills | Status: DC

## 2024-04-25 NOTE — Telephone Encounter (Signed)
 Done

## 2024-05-05 ENCOUNTER — Other Ambulatory Visit: Payer: Self-pay | Admitting: Family Medicine

## 2024-05-14 ENCOUNTER — Ambulatory Visit (AMBULATORY_SURGERY_CENTER)

## 2024-05-14 VITALS — Ht 72.0 in | Wt 193.0 lb

## 2024-05-14 DIAGNOSIS — Z1211 Encounter for screening for malignant neoplasm of colon: Secondary | ICD-10-CM

## 2024-05-14 MED ORDER — NA SULFATE-K SULFATE-MG SULF 17.5-3.13-1.6 GM/177ML PO SOLN: 1.0000 | Freq: Once | ORAL | 0 refills | Status: AC

## 2024-05-14 NOTE — Progress Notes (Signed)

## 2024-05-16 ENCOUNTER — Encounter: Payer: Self-pay | Admitting: Gastroenterology

## 2024-05-26 ENCOUNTER — Encounter: Payer: Self-pay | Admitting: Gastroenterology

## 2024-05-26 ENCOUNTER — Ambulatory Visit (AMBULATORY_SURGERY_CENTER): Admitting: Gastroenterology

## 2024-05-26 VITALS — BP 105/61 | HR 87 | Temp 97.9°F | Resp 20 | Ht 72.0 in | Wt 193.0 lb

## 2024-05-26 DIAGNOSIS — Z1211 Encounter for screening for malignant neoplasm of colon: Secondary | ICD-10-CM

## 2024-05-26 DIAGNOSIS — D123 Benign neoplasm of transverse colon: Secondary | ICD-10-CM | POA: Diagnosis not present

## 2024-05-26 DIAGNOSIS — K5730 Diverticulosis of large intestine without perforation or abscess without bleeding: Secondary | ICD-10-CM

## 2024-05-26 DIAGNOSIS — K635 Polyp of colon: Secondary | ICD-10-CM

## 2024-05-26 DIAGNOSIS — D120 Benign neoplasm of cecum: Secondary | ICD-10-CM

## 2024-05-26 DIAGNOSIS — K640 First degree hemorrhoids: Secondary | ICD-10-CM

## 2024-05-26 DIAGNOSIS — K648 Other hemorrhoids: Secondary | ICD-10-CM | POA: Diagnosis not present

## 2024-05-26 DIAGNOSIS — D125 Benign neoplasm of sigmoid colon: Secondary | ICD-10-CM

## 2024-05-26 MED ORDER — SODIUM CHLORIDE 0.9 % IV SOLN: 500.0000 mL | Freq: Once | INTRAVENOUS | Status: AC

## 2024-05-26 NOTE — Patient Instructions (Signed)
 YOU HAD AN ENDOSCOPIC PROCEDURE TODAY AT THE [facility]:   Refer to the procedure report that was given to you for any specific questions about what was found during the examination.  If the procedure report does not answer your questions, please call your gastroenterologist to clarify.  If you requested that your care partner not be given the details of your procedure findings, then the procedure report has been included in a sealed envelope for you to review at your convenience later.  YOU SHOULD EXPECT: Some feelings of bloating in the abdomen. Passage of more gas than usual.  Walking can help get rid of the air that was put into your GI tract during the procedure and reduce the bloating. If you had a lower endoscopy (such as a colonoscopy or flexible sigmoidoscopy) you may notice spotting of blood in your stool or on the toilet paper. If you underwent a bowel prep for your procedure, you may not have a normal bowel movement for a few days.  Please Note:  You might notice some irritation and congestion in your nose or some drainage.  This is from the oxygen used during your procedure.  There is no need for concern and it should clear up in a day or so.  SYMPTOMS TO REPORT IMMEDIATELY:  Following lower endoscopy (colonoscopy or flexible sigmoidoscopy):  Excessive amounts of blood in the stool  Significant tenderness or worsening of abdominal pains  Swelling of the abdomen that is new, acute  Fever of 100F or higher  Resume previous diet Continue present medications Await pathology results Return to GI clinic as needed Handouts on polyps, hemorrhoids and diverticulosis given   For urgent or emergent issues, a gastroenterologist can be reached at any hour by calling [phone]. Do not use MyChart messaging for urgent concerns.    DIET:  We do recommend a small meal at first, but then you may proceed to your regular diet.  Drink plenty of fluids but you should avoid  alcoholic beverages for 24 hours.  ACTIVITY:  You should plan to take it easy for the rest of today and you should NOT DRIVE or use heavy machinery until tomorrow (because of the sedation medicines used during the test).    FOLLOW UP: Our staff will call the number listed on your records the next business day following your procedure.  We will call around 7:15- 8:00 am to check on you and address any questions or concerns that you may have regarding the information given to you following your procedure. If we do not reach you, we will leave a message.     If any biopsies were taken you will be contacted by phone or by letter within the next 1-3 weeks.  Please call us  at [phone] if you have not heard about the biopsies in 3 weeks.    SIGNATURES/CONFIDENTIALITY: You and/or your care partner have signed paperwork which will be entered into your electronic medical record.  These signatures attest to the fact that that the information above on your After Visit Summary has been reviewed and is understood.  Full responsibility of the confidentiality of this discharge information lies with you and/or your care-partner.

## 2024-05-26 NOTE — Progress Notes (Signed)
 Sedate, gd SR, tolerated procedure well, VSS, report to RN

## 2024-05-26 NOTE — Progress Notes (Signed)
 GASTROENTEROLOGY PROCEDURE H&P NOTE   Primary Care Physician: Johnny Garnette LABOR, MD    Reason for Procedure:  Colon Cancer screening  Plan:    Colonoscopy  Patient is appropriate for endoscopic procedure(s) in the ambulatory ([facility]) setting.  The nature of the procedure, as well as the risks, benefits, and alternatives were carefully and thoroughly reviewed with the patient. Ample time for discussion and questions allowed. The patient understood, was satisfied, and agreed to proceed.     HPI: Shane Reese is a 58 y.o. male who presents for colonoscopy for routine Colon Cancer screening.  No active GI symptoms.  No known family history of colon cancer or related malignancy.  Patient is otherwise without complaints or active issues today.  Past Medical History:  Diagnosis Date   Adenopathy, cervical 01/06/2014   ADHD (attention deficit hyperactivity disorder)    Alcohol  dependence [facility])    sees [employer].  Abused cocaine and narcotics in the past    Anterior cervical adenopathy 12/22/2013   Attention deficit hyperactivity disorder (ADHD) 07/15/2007   Qualifier: Diagnosis of  By: Johnny MD, Garnette LABOR    Chronic low back pain 11/10/2015   Depression    Depression with anxiety 06/10/2007   Qualifier: Diagnosis of  By: Lilton CMA, Jacqualynn     Drug dependence    ED (erectile dysfunction)    ERECTILE DYSFUNCTION, ORGANIC 06/25/2009   Qualifier: Diagnosis of  By: Johnny MD, Garnette LABOR    Hypertension     Past Surgical History:  Procedure Laterality Date   l lower leg fx surgery      Prior to Admission medications   Medication Sig Start Date End Date Taking? Authorizing Provider  amphetamine -dextroamphetamine  (ADDERALL) 20 MG tablet Take 1 tablet (20 mg total) by mouth 3 (three) times daily. 04/25/24   Johnny Garnette LABOR, MD  amphetamine -dextroamphetamine  (ADDERALL) 20 MG tablet Take 1 tablet (20 mg total) by mouth 3 (three) times daily. 04/25/24   Johnny Garnette LABOR, MD   amphetamine -dextroamphetamine  (ADDERALL) 20 MG tablet Take 1 tablet (20 mg total) by mouth 3 (three) times daily. 04/25/24   Johnny Garnette LABOR, MD  famotidine  (PEPCID ) 10 MG tablet Take 10 mg by mouth as needed.    [provider]  omeprazole  (PRILOSEC) 40 MG capsule TAKE 1 CAPSULE BY MOUTH DAILY Patient taking differently: Take 40 mg by mouth daily as needed. 05/05/24   Johnny Garnette LABOR, MD  sildenafil  (VIAGRA ) 100 MG tablet Take 1 tablet (100 mg total) by mouth as needed for erectile dysfunction. 02/06/24   Johnny Garnette LABOR, MD  Syringe/Needle, Disp, (SYRINGE 3CC/22GX1-1/2) 22G X 1-1/2 3 ML MISC 1 application by Does not apply route every 14 (fourteen) days. 06/21/21   Johnny Garnette LABOR, MD  testosterone  cypionate (DEPOTESTOSTERONE CYPIONATE) 200 MG/ML injection INJECT 1 MILLILITER INTO THE MUSCLE EVERY 14 DAYS 02/06/24   Johnny Garnette LABOR, MD    Current Outpatient Medications  Medication Sig Dispense Refill   amphetamine -dextroamphetamine  (ADDERALL) 20 MG tablet Take 1 tablet (20 mg total) by mouth 3 (three) times daily. 90 tablet 0   amphetamine -dextroamphetamine  (ADDERALL) 20 MG tablet Take 1 tablet (20 mg total) by mouth 3 (three) times daily. 90 tablet 0   amphetamine -dextroamphetamine  (ADDERALL) 20 MG tablet Take 1 tablet (20 mg total) by mouth 3 (three) times daily. 90 tablet 0   famotidine  (PEPCID ) 10 MG tablet Take 10 mg by mouth as needed.     omeprazole  (PRILOSEC) 40 MG capsule TAKE 1  CAPSULE BY MOUTH DAILY (Patient taking differently: Take 40 mg by mouth daily as needed.) 90 capsule 3   sildenafil  (VIAGRA ) 100 MG tablet Take 1 tablet (100 mg total) by mouth as needed for erectile dysfunction. 30 tablet 11   Syringe/Needle, Disp, (SYRINGE 3CC/22GX1-1/2) 22G X 1-1/2 3 ML MISC 1 application by Does not apply route every 14 (fourteen) days. 50 each 3   testosterone  cypionate (DEPOTESTOSTERONE CYPIONATE) 200 MG/ML injection INJECT 1 MILLILITER INTO THE MUSCLE EVERY 14 DAYS 6 mL 3   No  current facility-administered medications for this visit.    Allergies as of 05/26/2024 - Review Complete 05/14/2024  Allergen Reaction Noted   Losartan  Swelling 07/20/2020    Family History  Problem Relation Age of Onset   Colon cancer Neg Hx    Rectal cancer Neg Hx    Stomach cancer Neg Hx     Social History   Socioeconomic History   Marital status: Married    Spouse name: Not on file   Number of children: Not on file   Years of education: Not on file   Highest education level: Not on file  Occupational History   Not on file  Tobacco Use   Smoking status: Never   Smokeless tobacco: Never  Substance and Sexual Activity   Alcohol  use: Yes    Alcohol /week: 0.0 standard drinks of alcohol     Comment: occ   Drug use: Yes    Comment: heroin and oxycodone    Sexual activity: Not on file  Other Topics Concern   Not on file  Social History Narrative   Occupation:   Married   Never Smoker   Alcohol  use-yes   Drug use-no   Regular exercise-yes   Social Drivers of Corporate investment banker Strain: Not on file  Food Insecurity: Not on file  Transportation Needs: Not on file  Physical Activity: Not on file  Stress: Not on file  Social Connections: Not on file  Intimate Partner Violence: Not on file    Physical Exam: Vital signs in last 24 hours: @There  were no vitals taken for this visit. GEN: NAD EYE: Sclerae anicteric ENT: MMM CV: Non-tachycardic Pulm: CTA b/l GI: Soft, NT/ND NEURO:  Alert & Oriented x 3   Sandor Flatter, DO [facility]   05/26/2024 10:19 AM

## 2024-05-26 NOTE — Op Note (Signed)
 [facility] Patient Name: Shane Reese Procedure Date: 05/26/2024 11:10 AM MRN: [id number] Endoscopist: Sandor Flatter , MD, [id number] Age: 58 Referring MD:  Date of Birth: [birth year] Gender: Male Account #: [id number] Procedure:                Colonoscopy Indications:              Screening for colorectal malignant neoplasm, This                            is the patient's first colonoscopy Medicines:                Monitored Anesthesia Care Procedure:                Pre-Anesthesia Assessment:                           - Prior to the procedure, a History and Physical                            was performed, and patient medications and                            allergies were reviewed. The patient's tolerance of                            previous anesthesia was also reviewed. The risks                            and benefits of the procedure and the sedation                            options and risks were discussed with the patient.                            All questions were answered, and informed consent                            was obtained. Prior Anticoagulants: The patient has                            taken no anticoagulant or antiplatelet agents. ASA                            Grade Assessment: II - A patient with mild systemic                            disease. After reviewing the risks and benefits,                            the patient was deemed in satisfactory condition to                            undergo the procedure.  After obtaining informed consent, the colonoscope                            was passed under direct vision. Throughout the                            procedure, the patient's blood pressure, pulse, and                            oxygen saturations were monitored continuously. The                            CF [id number] #[id number] was introduced through the anus                            and advanced to the the  cecum, identified by                            appendiceal orifice and ileocecal valve. The                            colonoscopy was performed without difficulty. The                            patient tolerated the procedure well. The quality                            of the bowel preparation was good. The ileocecal                            valve, appendiceal orifice, and rectum were                            photographed. Scope In: 11:24:16 AM Scope Out: 11:44:36 AM Scope Withdrawal Time: 0 hours 15 minutes 24 seconds  Total Procedure Duration: 0 hours 20 minutes 20 seconds  Findings:                 The perianal and digital rectal examinations were                            normal.                           Three sessile polyps were found in the transverse                            colon and cecum. The polyps were 3 to 6 mm in size.                            These polyps were removed with a cold snare.                            Resection and retrieval were complete. Estimated  blood loss was minimal.                           A 3 mm polyp was found in the sigmoid colon. The                            polyp was sessile. The polyp was removed with a                            cold snare. Resection and retrieval were complete.                            Estimated blood loss was minimal.                           A few small-mouthed diverticula were found in the                            sigmoid colon.                           Non-bleeding internal hemorrhoids were found during                            retroflexion. The hemorrhoids were small. Complications:            No immediate complications. Estimated Blood Loss:     Estimated blood loss was minimal. Impression:               - Three 3 to 6 mm polyps in the transverse colon                            and in the cecum, removed with a cold snare.                            Resected and  retrieved.                           - One 3 mm polyp in the sigmoid colon, removed with                            a cold snare. Resected and retrieved.                           - Diverticulosis in the sigmoid colon.                           - Non-bleeding internal hemorrhoids. Recommendation:           - Patient has a contact number available for                            emergencies. The signs and symptoms of potential  delayed complications were discussed with the                            patient. Return to normal activities tomorrow.                            Written discharge instructions were provided to the                            patient.                           - Resume previous diet.                           - Continue present medications.                           - Await pathology results.                           - Repeat colonoscopy for surveillance based on                            pathology results.                           - Return to GI clinic PRN. Sandor Flatter, MD 05/26/2024 11:48:05 AM

## 2024-05-26 NOTE — Progress Notes (Signed)
 Called to room to assist during endoscopic procedure.  Patient ID and intended procedure confirmed with present staff. Received instructions for my participation in the procedure from the performing physician.

## 2024-05-27 ENCOUNTER — Telehealth: Payer: Self-pay

## 2024-05-27 NOTE — Telephone Encounter (Signed)
 No answer on follow-up call. Left VM.

## 2024-05-28 LAB — SURGICAL PATHOLOGY

## 2024-06-03 ENCOUNTER — Ambulatory Visit: Payer: Self-pay | Admitting: Gastroenterology

## 2024-07-10 ENCOUNTER — Encounter: Payer: Self-pay | Admitting: Family Medicine

## 2024-07-11 ENCOUNTER — Telehealth: Payer: Self-pay | Admitting: *Deleted

## 2024-07-11 MED ORDER — MELOXICAM 15 MG PO TABS: 15.0000 mg | ORAL_TABLET | Freq: Every day | ORAL | 3 refills | Status: AC

## 2024-07-11 MED ORDER — AMPHETAMINE-DEXTROAMPHETAMINE 20 MG PO TABS: 20.0000 mg | ORAL_TABLET | Freq: Three times a day (TID) | ORAL | 0 refills | Status: AC

## 2024-07-11 MED ORDER — AMPHETAMINE-DEXTROAMPHETAMINE 20 MG PO TABS: 20.0000 mg | ORAL_TABLET | Freq: Three times a day (TID) | ORAL | 0 refills | Status: DC

## 2024-07-11 NOTE — Telephone Encounter (Signed)
 Copied from CRM #[id number]. Topic: Clinical - Prescription Issue >> Jul 11, 2024  1:27 PM Dedra B wrote: Reason for CRM: Leeroy from [facility] called regarding prescription for amphetamine -dextroamphetamine  (ADDERALL) 20 MG tablet. Each prescription says effective and recent dat 10/31, but note says fill on 07/26/24, 12/15 25, and 09/25/2024. She said that this is inaccurate and should list the effective date as the date Dr. Johnny wants prescription filled. She canceled one prescription already but would like for Dr. Johnny to cancel the other two prescriptions and resend with effective dates as the future date to be filled. Do not put anything in the notes section. If any questions, please call Leeroy at [facility] [phone].

## 2024-07-11 NOTE — Telephone Encounter (Signed)
 The pharmacist is wrong. This is the way we have sent in prescriptions for controlled medications for years. What she is proposing is we send in a single refill every single month, which is ridiculous

## 2024-07-11 NOTE — Telephone Encounter (Signed)
 Done

## 2024-07-14 NOTE — Telephone Encounter (Signed)
 Spoke with pt pharmacy stated to disregard the message, pt picked up Adderall Rx for Jul 03 2024, remaining refills are for Nov/Dec

## 2024-09-09 ENCOUNTER — Other Ambulatory Visit: Payer: Self-pay | Admitting: Gastroenterology

## 2024-09-09 DIAGNOSIS — Z1211 Encounter for screening for malignant neoplasm of colon: Secondary | ICD-10-CM

## 2024-10-03 ENCOUNTER — Encounter: Payer: Self-pay | Admitting: Family Medicine

## 2024-10-07 MED ORDER — AZITHROMYCIN 250 MG PO TABS: ORAL_TABLET | ORAL | 0 refills | Status: AC

## 2024-10-07 NOTE — Telephone Encounter (Signed)
 I sent in a Zpack for him

## 2024-10-12 ENCOUNTER — Encounter: Payer: Self-pay | Admitting: Family Medicine

## 2024-10-15 NOTE — Telephone Encounter (Signed)
 He will need an in person OV for this (check BP, etc)

## 2024-10-16 ENCOUNTER — Encounter: Payer: Self-pay | Admitting: Family Medicine

## 2024-10-16 MED ORDER — AMPHETAMINE-DEXTROAMPHETAMINE 20 MG PO TABS: 20.0000 mg | ORAL_TABLET | Freq: Three times a day (TID) | ORAL | 0 refills | Status: AC

## 2024-10-16 NOTE — Addendum Note (Signed)
 Addended by: JOHNNY SENIOR A on: 10/16/2024 01:12 PM   Modules accepted: Orders

## 2024-10-16 NOTE — Telephone Encounter (Signed)
"  I sent in a one month supply.  "
